# Patient Record
Sex: Male | Born: 1970 | Race: Black or African American | Hispanic: No | Marital: Single | Smoking: Never smoker
Health system: Southern US, Community
[De-identification: ages and names within clinical notes are randomized; demographics above are authoritative.]

## PROBLEM LIST (undated history)

## (undated) DIAGNOSIS — E785 Hyperlipidemia, unspecified: Secondary | ICD-10-CM

## (undated) DIAGNOSIS — I10 Essential (primary) hypertension: Secondary | ICD-10-CM

## (undated) DIAGNOSIS — I2510 Atherosclerotic heart disease of native coronary artery without angina pectoris: Secondary | ICD-10-CM

## (undated) DIAGNOSIS — E119 Type 2 diabetes mellitus without complications: Secondary | ICD-10-CM

## (undated) DIAGNOSIS — J45909 Unspecified asthma, uncomplicated: Secondary | ICD-10-CM

## (undated) DIAGNOSIS — E669 Obesity, unspecified: Secondary | ICD-10-CM

## (undated) HISTORY — DX: Type 2 diabetes mellitus without complications: E11.9

## (undated) HISTORY — PX: NO PAST SURGERIES: SHX2092

---

## 2010-06-15 ENCOUNTER — Emergency Department: Payer: Self-pay | Admitting: Emergency Medicine

## 2010-06-28 ENCOUNTER — Emergency Department: Payer: Self-pay | Admitting: Family Medicine

## 2013-09-08 ENCOUNTER — Ambulatory Visit: Payer: Self-pay

## 2013-10-07 ENCOUNTER — Inpatient Hospital Stay: Payer: Self-pay | Admitting: Psychiatry

## 2013-10-07 LAB — DRUG SCREEN, URINE
Barbiturates, Ur Screen: NEGATIVE (ref ?–200)
Benzodiazepine, Ur Scrn: NEGATIVE (ref ?–200)
Cocaine Metabolite,Ur ~~LOC~~: NEGATIVE (ref ?–300)
Methadone, Ur Screen: NEGATIVE (ref ?–300)
Opiate, Ur Screen: NEGATIVE (ref ?–300)
Phencyclidine (PCP) Ur S: NEGATIVE (ref ?–25)
Tricyclic, Ur Screen: NEGATIVE (ref ?–1000)

## 2013-10-07 LAB — URINALYSIS, COMPLETE
Bacteria: NONE SEEN
Glucose,UR: NEGATIVE mg/dL (ref 0–75)
Leukocyte Esterase: NEGATIVE
RBC,UR: 1 /HPF (ref 0–5)
Squamous Epithelial: NONE SEEN

## 2013-10-07 LAB — ETHANOL: Ethanol: <3 mg/dL

## 2013-10-07 LAB — COMPREHENSIVE METABOLIC PANEL
Albumin: 4.3 g/dL (ref 3.4–5.0)
Alkaline Phosphatase: 67 U/L (ref 50–136)
Anion Gap: 3 — ABNORMAL LOW (ref 7–16)
BUN: 17 mg/dL (ref 7–18)
Bilirubin,Total: 0.4 mg/dL (ref 0.2–1.0)
Chloride: 107 mmol/L (ref 98–107)
Co2: 29 mmol/L (ref 21–32)
EGFR (Non-African Amer.): >60
Osmolality: 279 (ref 275–301)
SGPT (ALT): 32 U/L (ref 12–78)

## 2013-10-07 LAB — TSH: Thyroid Stimulating Horm: 1.8 u[IU]/mL

## 2013-10-07 LAB — CBC
HCT: 46.2 % (ref 40.0–52.0)
MCV: 88 fL (ref 80–100)
RBC: 5.27 10*6/uL (ref 4.40–5.90)

## 2015-03-21 NOTE — Consult Note (Signed)
PATIENT NAME:  Oscar GivensWHITE, Oscar J MR#:  [id number] DATE OF BIRTH:  [birth year]  AGE:  44 years  SEX:  Male  RACE:  African-American  DATE OF DICTATION:  10/07/2013  PLACE OF DICTATION:  [facility], [facility] - [facility] Room, [place]    CONSULTING PHYSICIAN:  Khaleesi Gruel K. Asha Grumbine, MD  SUBJECTIVE:  The patient was seen in consultation in [facility] at [facility]. The patient is a 44 year old African-American male, not employed. Last worked many years ago. He is divorced for many years, and has been living with his mother, who is in her late 7060s. The patient reports that he was incarcerated from [id number] to 2011, and is currently with vocational rehabilitation for the past one year.   CHIEF COMPLAINT:  "I am ready to have a nervous breakdown."  HISTORY OF PRESENT ILLNESS:  The patient reports that he has been feeling depressed, been feeling very anxious, and wanting to talk to somebody because he has not reported this.   PAST PSYCHIATRIC HISTORY:  No previous history of inpatient psychiatry. No history of suicide attempts. Not being followed by a psychiatrist. Alcohol and drugs: Admits that he used to drink alcohol at a rate of 1 pint of hard liquor, such as vodka, a day for several years. Last drank 3 days ago, and he said he does not drink anymore. Denies any other street or prescription drug abuse. Denies smoking nicotine cigarettes.  MENTAL STATUS:  The patient was [facility] in [facility]. Alert and oriented, appears very anxious, talking about himself. Admits feeling depressed. Admits feeling low and down. Admits that he feels like he is ready to have a  nervous breakdown, and he feels that agitation coming out, and is restless and angry about the same. Denies auditory or visual hallucinations, but reports that he is so agitated that he feels he is paranoid . [place] [address], and his date of birth. Memory and recall are safe. Does have suicidal wishes and thoughts,  but contracts for safety, and he wants to get help as he has not slept in 2 days. Insight and judgment guarded.  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 Alcohol dependence, chronic, continuous. Personality disorder, with antisocial traits, and has been out of prison since 2011, after being incarcerated for several years.   RECOMMENDATIONS:  Recommend inpatient hospital in Psychiatry, Behavioral Health, for close observation. Meanwhile, he will be started on trazodone 100 mg at bedtime to help him with his sleep and rest. In addition, we started Invega 9 mg p.o. daily to help him with his inner agitation and restlessness. [facility].   ____________________________ Jannet MantisSurya K. Guss Bundehalla, MD skc:mr D: 10/07/2013 15:35:43 ET T: 10/07/2013 18:16:58 ET JOB#: [id number]  cc: Monika SalkSurya K. Guss Bundehalla, MD, <Dictator> Beau FannySURYA K Cashton Hosley MD ELECTRONICALLY SIGNED 10/08/2013 9:08

## 2015-03-21 NOTE — H&P (Signed)
PATIENT NAME:  Oscar Norris, Ordean J MR#:  [id number] DATE OF BIRTH:  [birth year]  DATE OF ADMISSION:  10/07/2013  REFERRING PHYSICIAN:  Emergency Room MD  ATTENDING PHYSICIAN: Sanaai Doane B. Jennet MaduroPucilowska, M.D.   IDENTIFYING DATA: [name] is a 44 year old male with reported history of schizophrenia and alcohol dependence.   CHIEF COMPLAINT:  "I'm very nervous."  HISTORY OF PRESENT ILLNESS: [name] was diagnosed with schizophrenia a couple of months ago reportedly by his vocational rehab counselor. He has been connected to [facility] and  has been receiving treatment with Haldol. Initially this was Haldol other pills, but later it was switched to Haldol decanoate. He has been given 100 mg IM of Haldol decanoate at the end of each month. He reports at the end of October he received dose. He is unclear why oral formulation was switched to injectable. He believes that this was because he developed back pain that was thought to be a side effect of Haldol and that is why he was switched to injection. The patient is not a good historian it is quite likely that his intellectual functioning is lower than average. He reports that he was in prison for 14 years for assault and was released in 2011. He has been trying to find a job, but it was not possible, but he started working with [employer] [employer] in hopes that he has a chance to be employed by [employer]. This is my understanding, work arrangement for people with disability. He believes that he would be able to get to this job soon. He struggles with alcohol. He drinks a pint of liquor each day and decided to stop since he wants to be sober and clean when he goes to work. He stopped drinking couple of days ago and most likely started going into alcohol withdrawals. He came to the hospital because he felt that he was in a nervous breakdown. He mostly blames insomnia for it. He has not been able to sleep for several days and feels very antsy, tired and unable to function.  He denies any symptoms of depression or psychosis. He appears anxious and reports panic attacks. He denies, other than alcohol, substance use. There were no symptoms suggestive of bipolar mania.   PAST PSYCHIATRIC HISTORY: He has never been hospitalized. No suicide attempts. [facility]. Interestingly, he was not in mental health while in prison.   FAMILY PSYCHIATRIC HISTORY: None reported.   PAST MEDICAL HISTORY: None.   ALLERGIES: No known drug allergies.   MEDICATIONS ON ADMISSION: Haldol decanoate 100 mg IM every month, albuterol as needed.   SOCIAL HISTORY: He lives with his mother. He is divorced and the mother is the major support.    REVIEW OF SYSTEMS: CONSTITUTIONAL: No fevers or chills. No weight changes.  EYES: No double or blurred vision.  ENT: No hearing loss.  RESPIRATORY: No shortness of breath or cough.  CARDIOVASCULAR: No chest pain or orthopnea.  GASTROINTESTINAL: No abdominal pain, nausea, vomiting or diarrhea.  GENITOURINARY: No incontinence or frequency.  ENDOCRINE: No heat or cold intolerance.  LYMPHATIC: No anemia or easy bruising.  INTEGUMENTARY: No acne or rash.  MUSCULOSKELETAL: No muscle or joint pain.  NEUROLOGIC: No tingling or weakness.  PSYCHIATRIC: See history of present illness for details.   PHYSICAL EXAMINATION: VITAL SIGNS: Blood pressure 143/95, pulse 78, respirations 20, temperature 98.  GENERAL: This is a well-developed male in no acute distress.  HEENT: The pupils are equal, round, and reactive to light.  Sclerae anicteric.  NECK: Supple. No thyromegaly.  LUNGS: Clear to auscultation. No done as to percussion.  HEART: Regular rhythm and rate. No murmurs, rubs, or gallops.  ABDOMEN: Soft, nontender, nondistended. Positive bowel sounds.  MUSCULOSKELETAL: Normal muscle strength in all extremities.  SKIN: No rashes or bruises.  LYMPHATIC: No cervical adenopathy.  NEUROLOGIC: Cranial nerves II through XII are intact.   LABORATORY DATA: Chemistries are within normal limits. Blood alcohol level is zero. LFTs within normal limits. TSH 1.8. Urine tox screen is negative for substances. CBC within normal limits. Urinalysis is not suggestive of urinary tract infection.   MENTAL STATUS EXAMINATION ON ADMISSION: The patient is alert and oriented to person, place, time and situation. He is pleasant, polite and cooperative. He is well groomed, wearing hospital scrubs. He maintains good eye contact. His speech is soft. Mood is depressed with anxious affect. Thought process is logical and goal oriented. Thought content: He denies suicidal or homicidal ideation. There are no delusions or paranoia. There are no auditory or visual hallucinations. His cognition is grossly intact. His insight and judgment are fair.   SUICIDE RISK ASSESSMENT ON ADMISSION: This is a patient with newly diagnosed psychotic disorder, most likely with some anxiety and insomnia who came to the hospital thinking that he nervous breakdown with some vague suicidal ideation without intention or a plan. He is at increased risk of suicide.   DIAGNOSES: AXIS I: Schizophrenia per history, anxiety disorder not otherwise specified.   AXIS II: Borderline intellectual functioning.   AXIS III: Asthma.   AXIS IV: Mental illness, employment, financial, access to care.   AXIS V: Global assessment of functioning 25.   PLAN: The patient was admitted to [facility] Medicine unit for safety, stabilization and medication management. He was initially placed on suicide precautions and was closely monitored for any unsafe behaviors. He underwent full psychiatric and risk assessment. He received pharmacotherapy, individual and group psychotherapy, substance abuse counseling, and support from therapeutic milieu.  1.  Psychosis. The patient is on Haldol injections but this was not known to Dr. Guss Bunde, who also started him on Invega oral tablets.   We will discontinue Invega.  He may need antianxiety medication rather than another antipsychotic.   2.  Insomnia. We will try trazodone as prescribed by Dr. Guss Bunde, adjusted as necessary.  3.  Medical.  He will be on albuterol. He complains of some neck pain and lower back pain that was thought to be due to EPS. I find it is unlikely. We will prescribe nonsteroidal antiinflammatory for that.   4. Substance abuse. The patient has been drinking alcohol excessively. He is on CIWA protocol. We will monitor for symptoms of withdrawal.  5.  Substance abuse treatment. He is not interested at the moment.    DISPOSITION: He will return to home with his mother.   ____________________________ Ellin Goodie. Jennet Maduro, MD jbp:cc D: 10/08/2013 18:26:26 ET T: 10/08/2013 19:14:09 ET JOB#: [id number]  cc: Vedha Tercero B. Jennet Maduro, MD, <Dictator> Shari Prows MD ELECTRONICALLY SIGNED 10/12/2013 20:44

## 2017-06-16 ENCOUNTER — Encounter: Payer: Self-pay | Admitting: *Deleted

## 2017-06-16 ENCOUNTER — Ambulatory Visit
Admission: EM | Admit: 2017-06-16 | Discharge: 2017-06-16 | Disposition: A | Discharge status: Home / Self Care | Payer: Medicaid Other | Attending: Family Medicine | Admitting: Family Medicine

## 2017-06-16 DIAGNOSIS — R300 Dysuria: Secondary | ICD-10-CM | POA: Diagnosis not present

## 2017-06-16 DIAGNOSIS — N342 Other urethritis: Secondary | ICD-10-CM

## 2017-06-16 LAB — URINALYSIS, COMPLETE (UACMP) WITH MICROSCOPIC
BILIRUBIN URINE: NEGATIVE
Bacteria, UA: NONE SEEN
GLUCOSE, UA: NEGATIVE mg/dL
Hgb urine dipstick: NEGATIVE
KETONES UR: NEGATIVE mg/dL
LEUKOCYTES UA: NEGATIVE
NITRITE: NEGATIVE
PH: 5.5 (ref 5.0–8.0)
Protein, ur: 30 mg/dL — AB
RBC / HPF: NONE SEEN RBC/hpf (ref 0–5)
Squamous Epithelial / LPF: NONE SEEN

## 2017-06-16 LAB — CHLAMYDIA/NGC RT PCR (ARMC ONLY)
Chlamydia Tr: NOT DETECTED
N GONORRHOEAE: NOT DETECTED

## 2017-06-16 MED ORDER — DOXYCYCLINE HYCLATE 100 MG PO CAPS: 100.0000 mg | ORAL_CAPSULE | Freq: Two times a day (BID) | ORAL | 0 refills | Status: DC

## 2017-06-16 NOTE — ED Triage Notes (Signed)
Dysuria following sex with a new partner. Denies other symptoms.

## 2017-06-16 NOTE — ED Provider Notes (Signed)
CSN: [id number]     Arrival date & time 06/16/17  1040 History   First MD Initiated Contact with Patient 06/16/17 1143     Chief Complaint  Patient presents with  . Dysuria   (Consider location/radiation/quality/duration/timing/severity/associated sxs/prior Treatment) HPI  This 46 year old male presents with complaints of dysuria. He states that approximately one week ago he had a 1 night stand with an acquaintance. He states that he wore a condom and did not have any problems with the condom during the sex act. Afterwards his initial urination was extremely painful feeling like he was peeing razor blades. It has continued since that time with each urination but has lessened in intensity and now has a tingling sensation. He feels this along the entire shaft of the penis. He denies any fever or chills. He has had no discharge from his penis. He does not have any testicular pain has not noticed any rashes or sores on his penile shaft or head.       History reviewed. No pertinent past medical history. History reviewed. No pertinent surgical history. Family History  Problem Relation Age of Onset  . Healthy Mother   . Healthy Father    Social History  Substance Use Topics  . Smoking status: Never Smoker  . Smokeless tobacco: Current User  . Alcohol use Yes    Review of Systems  Constitutional: Negative for activity change, appetite change, chills, fatigue and fever.  Genitourinary: Positive for difficulty urinating, dysuria and penile pain. Negative for decreased urine volume, discharge, genital sores, penile swelling, scrotal swelling, testicular pain and urgency.  All other systems reviewed and are negative.   Allergies  Patient has no known allergies.  Home Medications   Prior to Admission medications   Medication Sig Start Date End Date Taking? Authorizing Provider  mirtazapine (REMERON) 30 MG tablet Take 30 mg by mouth at bedtime.   Yes [provider]   doxycycline (VIBRAMYCIN) 100 MG capsule Take 1 capsule (100 mg total) by mouth 2 (two) times daily. 06/16/17   Lutricia Feil, PA-C   Meds Ordered and Administered this Visit  Medications - No data to display  BP 133/90 (BP Location: Left Arm)   Pulse 72   Temp 98.8 F (37.1 C) (Oral)   Resp 16   Ht 5\' 10"  (1.778 m)   Wt 175 lb (79.4 kg)   SpO2 97%   BMI 25.11 kg/m  No data found.   Physical Exam  Constitutional: He appears well-developed and well-nourished. No distress.  HENT:  Head: Normocephalic.  Eyes: Pupils are equal, round, and reactive to light.  Neck: Normal range of motion.  Abdominal: Soft. Bowel sounds are normal.  Genitourinary: Penis normal. No penile tenderness.  Genitourinary Comments: Examination of the genitals shows a normal male phallus. He is circumcised. There are no abnormalities of the skin, is no inguinal lymphadenopathy present. Is no discharge able to be expressed from the penis. Scrotum normal testicles are normal. Prostate exam was deferred to urology  Skin: He is not diaphoretic.  Nursing note and vitals reviewed.   Urgent Care Course     Procedures (including critical care time)  Labs Review Labs Reviewed  URINALYSIS, COMPLETE (UACMP) WITH MICROSCOPIC - Abnormal; Notable for the following:       Result Value   Specific Gravity, Urine >1.030 (*)    Protein, ur 30 (*)    All other components within normal limits  CHLAMYDIA/NGC RT PCR ([facility] ONLY)  HIV ANTIBODY (ROUTINE  TESTING)  RPR    Imaging Review No results found.   Visual Acuity Review  Right Eye Distance:   Left Eye Distance:   Bilateral Distance:    Right Eye Near:   Left Eye Near:    Bilateral Near:         MDM   1. Dysuria   2. Urethritis    Discharge Medication List as of 06/16/2017 12:13 PM    START taking these medications   Details  doxycycline (VIBRAMYCIN) 100 MG capsule Take 1 capsule (100 mg total) by mouth 2 (two) times daily.,  Starting Thu 06/16/2017, Normal      Plan: 1. Test/x-ray results and diagnosis reviewed with patient 2. rx as per orders; risks, benefits, potential side effects reviewed with patient 3. Recommend supportive treatment with practicing safe sex at all times. Will empirically treat possible urethritis with doxycycline awaiting results of the GC chlamydia HIV and RPR exams. Recommended the [facility] for possible appointment next week. He should follow-up with urology for evaluation. 4. F/u prn if symptoms worsen or don't improve     Lutricia FeilRoemer, Keny Donald P, PA-C 06/16/17 1234

## 2017-06-17 LAB — RPR: RPR Ser Ql: NONREACTIVE

## 2017-06-17 LAB — HIV ANTIBODY (ROUTINE TESTING W REFLEX): HIV Screen 4th Generation wRfx: NONREACTIVE

## 2017-06-22 ENCOUNTER — Telehealth: Payer: Self-pay | Admitting: *Deleted

## 2017-06-22 NOTE — Telephone Encounter (Signed)
Patient called after [facility]. Verified DOB, communicated negative gonorrhea, chlamydia, RPR, HIV, and urinalysis results. Patient confirmed understanding of lab results.

## 2017-08-17 ENCOUNTER — Ambulatory Visit
Admission: EM | Admit: 2017-08-17 | Discharge: 2017-08-17 | Disposition: A | Discharge status: Home / Self Care | Payer: Medicaid Other | Attending: Family Medicine | Admitting: Family Medicine

## 2017-08-17 DIAGNOSIS — S99921A Unspecified injury of right foot, initial encounter: Secondary | ICD-10-CM | POA: Diagnosis not present

## 2017-08-17 DIAGNOSIS — L84 Corns and callosities: Secondary | ICD-10-CM | POA: Diagnosis not present

## 2017-08-17 NOTE — ED Provider Notes (Addendum)
[facility] ____________________________________________  Time seen: Approximately 1024 AM  I have reviewed the triage vital signs and the nursing notes.   HISTORY  Chief Complaint Rash   HPI Oscar HIRT Sr. is a 46 y.o. male presenting for evaluation of right plantar foot rash that he states that he noticed 2 days ago when he had a shower. Patient reports he doesn't check his feet all the time, but reports he had not previously noticed this until Monday. Patient denies any pain or discomfort or itching associated with this. States he didn't even know it was there until he saw it. Patient reports he doesn't walk barefoot often, usually wears socks when he is wearing his shoes, but does have some black stool. Flaps that he often wears. Denies any pain. Denies injury, insect bite or known break in the skin. States that the area was initially much darker, but after rubbing the area it lightened and improved. Denies other complaints. Denies other aggravating or relieving factors. Denies recent sickness. Denies recent antibiotic use. States he wanted to make sure it was ok, and not a bad rash.   History reviewed. No pertinent past medical history.  There are no active problems to display for this patient.   Past Surgical History:  Procedure Laterality Date  . NO PAST SURGERIES       No current facility-administered medications for this encounter.   Current Outpatient Prescriptions:  .  doxycycline (VIBRAMYCIN) 100 MG capsule, Take 1 capsule (100 mg total) by mouth 2 (two) times daily., Disp: 20 capsule, Rfl: 0 .  mirtazapine (REMERON) 30 MG tablet, Take 30 mg by mouth at bedtime., Disp: , Rfl:   Allergies Patient has no known allergies.  Family History  Problem Relation Age of Onset  . Healthy Mother   . Healthy Father     Social History Social History  Substance Use Topics  . Smoking status: Never Smoker  . Smokeless tobacco: Never Used  . Alcohol use Yes       Comment: occasionally    Review of Systems Constitutional: No fever/chills Cardiovascular: Denies chest pain. Respiratory: Denies shortness of breath. Gastrointestinal: No abdominal pain. Musculoskeletal: Negative for back pain. Skin: As above.    ____________________________________________   PHYSICAL EXAM:  VITAL SIGNS: ED Triage Vitals  Enc Vitals Group     BP 08/17/17 1011 (!) 145/90     Pulse Rate 08/17/17 1011 73     Resp 08/17/17 1011 17     Temp 08/17/17 1011 98.2 F (36.8 C)     Temp Source 08/17/17 1011 Oral     SpO2 08/17/17 1011 99 %     Weight 08/17/17 1010 170 lb (77.1 kg)     Height 08/17/17 1010  (1.778 m)     Head Circumference --      Peak Flow --      Pain Score 08/17/17 1010 0     Pain Loc --      Pain Edu? --      Excl. in GC? --     Constitutional: Alert and oriented. Well appearing and in no acute distress. Cardiovascular: Normal rate, regular rhythm. Grossly normal heart sounds.  Good peripheral circulation. Respiratory: Normal respiratory effort without tachypnea nor retractions. Breath sounds are clear and equal bilaterally. No wheezes, rales, rhonchi. Musculoskeletal: Steady gait. Bilateral pedal pulses equal and easily palpated.  Neurologic:  Normal speech and language. Speech is normal. No gait instability.  Skin:  Skin is warm,  dry. Except: Right plantar mid to lateral distal foot superficial appearing partially calloused area with cracks with dark coloration to fissured area, no other discoloration, nontender, skin otherwise appears intact, no erythema, no drainage, no macular rash, no scaling, no other skin changes noted. Left plantar foot also with similar calloused skin and cracking, no discoloration.  Psychiatric: Mood and affect are normal. Speech and behavior are normal. Patient exhibits appropriate insight and judgment   ___________________________________________   LABS (all labs ordered are listed, but only abnormal  results are displayed)  Labs Reviewed - No data to display  PROCEDURES Procedures   INITIAL IMPRESSION / ASSESSMENT AND PLAN / ED COURSE  Pertinent labs & imaging results that were available during my care of the patient were reviewed by me and considered in my medical decision making (see chart for details).  Right plantar foot calloused area, with appearance of pigmentation to calloused area, suspect from his shoes rubbing off, no other skin changes noted. No appearance of infection. Discussed cleaning, soaking and monitoring, and supportive care.   Discussed follow up with Primary care physician this week. Discussed follow up and return parameters including no resolution or any worsening concerns. Patient verbalized understanding and agreed to plan.   ____________________________________________   FINAL CLINICAL IMPRESSION(S) / ED DIAGNOSES  Final diagnoses:  Foot callus     Discharge Medication List as of 08/17/2017 10:34 AM      Note: This dictation was prepared with Dragon dictation along with smaller phrase technology. Any transcriptional errors that result from this process are unintentional.         Renford Dills, NP 08/17/17 1217

## 2017-08-17 NOTE — ED Triage Notes (Signed)
Patient complains of right foot rash that he noticed Monday night after a shower. Patient states that area is not itchy.

## 2017-08-17 NOTE — Discharge Instructions (Signed)
Soak and keep clean as discussed. Monitor.  Follow up with your primary care physician this week as needed. Return to Urgent care for new or worsening concerns.

## 2019-11-11 ENCOUNTER — Ambulatory Visit
Admission: EM | Admit: 2019-11-11 | Discharge: 2019-11-11 | Disposition: A | Discharge status: Home / Self Care | Payer: Medicaid Other | Attending: Family Medicine | Admitting: Family Medicine

## 2019-11-11 ENCOUNTER — Other Ambulatory Visit: Payer: Self-pay

## 2019-11-11 ENCOUNTER — Ambulatory Visit: Payer: Medicaid Other

## 2019-11-11 DIAGNOSIS — J1282 Pneumonia due to coronavirus disease 2019: Secondary | ICD-10-CM

## 2019-11-11 DIAGNOSIS — U071 COVID-19: Secondary | ICD-10-CM | POA: Diagnosis present

## 2019-11-11 DIAGNOSIS — J1289 Other viral pneumonia: Secondary | ICD-10-CM | POA: Diagnosis not present

## 2019-11-11 LAB — RAPID INFLUENZA A&B ANTIGENS (ARMC ONLY): Influenza A (ARMC): NEGATIVE

## 2019-11-11 LAB — RAPID INFLUENZA A&B ANTIGENS: Influenza B (ARMC): NEGATIVE

## 2019-11-11 LAB — SARS CORONAVIRUS 2 AG (30 MIN TAT): SARS Coronavirus 2 Ag: POSITIVE — AB

## 2019-11-11 LAB — RAPID STREP SCREEN (MED CTR MEBANE ONLY): Streptococcus, Group A Screen (Direct): NEGATIVE

## 2019-11-11 MED ORDER — PREDNISONE 50 MG PO TABS: ORAL_TABLET | ORAL | 0 refills | Status: DC

## 2019-11-11 MED ORDER — BENZONATATE 200 MG PO CAPS: 200.0000 mg | ORAL_CAPSULE | Freq: Three times a day (TID) | ORAL | 0 refills | Status: DC | PRN

## 2019-11-11 MED ORDER — ACETAMINOPHEN 500 MG PO TABS
1000.0000 mg | ORAL_TABLET | Freq: Once | ORAL | Status: AC
  Administered 2019-11-11: 1000 mg via ORAL

## 2019-11-11 NOTE — ED Provider Notes (Addendum)
[facility]    CSN: [id number] Arrival date & time: 11/11/19  1302  History   Chief Complaint Chief Complaint  Patient presents with  . Sore Throat   HPI  48 year old male presents with respiratory symptoms and fever.  Patient reports that he has been sick for the past 2 days.  He reports fever and chills.  He is currently febrile at 103.1.  Also reports fatigue, sore throat, headache, cough, body aches.  He rates his pain as 10/10 in severity currently.  Seems to be most bothered by headache and back pain.  No reported sick contacts.  No known exposures to COVID-19.  He has taken NyQuil and Tylenol without resolution.  He has not taken anything for fever today.  No known exacerbating factors.  No known relieving factors.  No other complaints.  PMH, Surgical Hx, Family Hx, Social History reviewed and updated as below.  PMH: HLD, Asthma, ? Schizophrenia  Past Surgical History:  Procedure Laterality Date  . NO PAST SURGERIES     Home Medications    Prior to Admission medications   Medication Sig Start Date End Date Taking? Authorizing Provider  escitalopram (LEXAPRO) 5 MG tablet Take 5 mg by mouth daily.   Yes [provider]  benzonatate (TESSALON) 200 MG capsule Take 1 capsule (200 mg total) by mouth 3 (three) times daily as needed for cough. 11/11/19   Coral Spikes, DO  mirtazapine (REMERON) 30 MG tablet Take 30 mg by mouth at bedtime.    [provider]  predniSONE (DELTASONE) 50 MG tablet 1 tablet daily x 5 days 11/11/19   Coral Spikes, DO    Family History Family History  Problem Relation Age of Onset  . Healthy Mother   . Healthy Father     Social History Social History   Tobacco Use  . Smoking status: Never Smoker  . Smokeless tobacco: Never Used  Substance Use Topics  . Alcohol use: Yes    Comment: occasionally  . Drug use: No     Allergies   Patient has no known allergies.   Review of Systems Review of Systems   Constitutional: Positive for chills, fatigue and fever.  HENT: Positive for sore throat.   Respiratory: Positive for cough.   Musculoskeletal:       Body aches.  Neurological: Positive for headaches.   Physical Exam Triage Vital Signs ED Triage Vitals  Enc Vitals Group     BP 11/11/19 1316 (!) 141/79     Pulse Rate 11/11/19 1316 (!) 103     Resp 11/11/19 1316 18     Temp 11/11/19 1316 (!) 103.1 F (39.5 C)     Temp Source 11/11/19 1316 Oral     SpO2 11/11/19 1316 96 %     Weight 11/11/19 1314 165 lb (74.8 kg)     Height --      Head Circumference --      Peak Flow --      Pain Score 11/11/19 1314 10     Pain Loc --      Pain Edu? --      Excl. in [place]? --     Updated Vital Signs BP (!) 141/79 (BP Location: Left Arm)   Pulse (!) 103   Temp (!) 103.1 F (39.5 C) (Oral)   Resp 18   Wt 74.8 kg   SpO2 96%   BMI 23.68 kg/m   Visual Acuity Right Eye Distance:   Left  Eye Distance:   Bilateral Distance:    Right Eye Near:   Left Eye Near:    Bilateral Near:     Physical Exam Vitals and nursing note reviewed.  Constitutional:      Appearance: He is ill-appearing and diaphoretic.  HENT:     Head: Normocephalic and atraumatic.  Eyes:     General:        Right eye: No discharge.        Left eye: No discharge.     Conjunctiva/sclera: Conjunctivae normal.  Cardiovascular:     Rate and Rhythm: Tachycardia present.     Comments: Regularly irregular. Pulmonary:     Effort: Pulmonary effort is normal.     Breath sounds: Normal breath sounds. No wheezing or rales.  Skin:    General: Skin is warm.     Findings: No rash.  Neurological:     Mental Status: He is alert.  Psychiatric:        Behavior: Behavior normal.     Comments: Flat affect.    UC Treatments / Results  Labs (all labs ordered are listed, but only abnormal results are displayed) Labs Reviewed  SARS CORONAVIRUS 2 AG (30 MIN TAT) - Abnormal; Notable for the following components:      Result Value    SARS Coronavirus 2 Ag POSITIVE (*)    All other components within normal limits  RAPID INFLUENZA A&B ANTIGENS ([facility] ONLY)  RAPID STREP SCREEN ([facility] ONLY)  CULTURE, GROUP A STREP [facility])    EKG   Radiology DG Chest 2 View  Result Date: 11/11/2019 CLINICAL DATA:  Fever, congestion EXAM: CHEST - 2 VIEW COMPARISON:  06/16/2010 FINDINGS: The heart size and mediastinal contours are within normal limits. Subtle heterogeneous airspace opacities, most conspicuous in the right midlung. The visualized skeletal structures are unremarkable. IMPRESSION: Subtle heterogeneous airspace opacities, most conspicuous in the right midlung, concerning for infection. Electronically Signed   By: Lauralyn PrimesAlex  Bibbey M.D.   On: 11/11/2019 14:36    Procedures Procedures (including critical care time)  Medications Ordered in UC Medications  acetaminophen (TYLENOL) tablet 1,000 mg (1,000 mg Oral Given 11/11/19 1356)    Initial Impression / Assessment and Plan / UC Course  I have reviewed the triage vital signs and the nursing notes.  Pertinent labs & imaging results that were available during my care of the patient were reviewed by me and considered in my medical decision making (see chart for details).     48 year old male presents with signs and symptoms consistent with COVID-19.  Rapid Covid positive.  Chest x-ray consistent with pneumonia secondary to COVID-19.  Discussed findings with radiology.  Patient has known asthma.  Placing empirically on brief course of prednisone.  Tessalon Perles as prescribed.  Advised to go to the hospital if he worsens or develops shortness of breath.  Patient in agreement.  Patient is to stay home and quarantine.  Final Clinical Impressions(s) / UC Diagnoses   Final diagnoses:  COVID-19  Pneumonia due to COVID-19 virus     Discharge Instructions     Medications as directed.  If you worsen, go to the hospital.  Take care  Dr. Adriana Simasook     ED Prescriptions     Medication Sig Dispense Auth. Provider   predniSONE (DELTASONE) 50 MG tablet 1 tablet daily x 5 days 5 tablet Levia Waltermire G, DO   benzonatate (TESSALON) 200 MG capsule Take 1 capsule (200 mg total) by mouth 3 (three)  times daily as needed for cough. 20 capsule Tommie Sams, DO     PDMP not reviewed this encounter.   Tommie Sams, DO 11/11/19 1457    Tommie Sams, DO 11/11/19 1502

## 2019-11-11 NOTE — Discharge Instructions (Signed)
Medications as directed.  If you worsen, go to the hospital.  Take care  Dr. Lacinda Axon

## 2019-11-11 NOTE — ED Triage Notes (Signed)
Pt. Presents a fever of 103.1, nasal congestion, body aches/chills, fatigue, sore throat, headache. States he sweats then is cold at the same time, this started 2 days ago.

## 2019-11-14 LAB — CULTURE, GROUP A STREP (THRC)

## 2019-11-15 ENCOUNTER — Encounter: Payer: Self-pay | Admitting: Emergency Medicine

## 2019-11-15 ENCOUNTER — Emergency Department
Admission: EM | Admit: 2019-11-15 | Discharge: 2019-11-15 | Disposition: A | Discharge status: Home / Self Care | Payer: Medicaid Other | Attending: Emergency Medicine | Admitting: Emergency Medicine

## 2019-11-15 ENCOUNTER — Emergency Department: Payer: Medicaid Other

## 2019-11-15 ENCOUNTER — Other Ambulatory Visit: Payer: Self-pay

## 2019-11-15 DIAGNOSIS — R0609 Other forms of dyspnea: Secondary | ICD-10-CM

## 2019-11-15 DIAGNOSIS — U071 COVID-19: Secondary | ICD-10-CM

## 2019-11-15 DIAGNOSIS — R0602 Shortness of breath: Secondary | ICD-10-CM | POA: Diagnosis present

## 2019-11-15 MED ORDER — ALBUTEROL SULFATE HFA 108 (90 BASE) MCG/ACT IN AERS: 2.0000 | INHALATION_SPRAY | Freq: Four times a day (QID) | RESPIRATORY_TRACT | 0 refills | Status: AC | PRN

## 2019-11-15 MED ORDER — KETOROLAC TROMETHAMINE 10 MG PO TABS: 10.0000 mg | ORAL_TABLET | Freq: Four times a day (QID) | ORAL | 0 refills | Status: DC | PRN

## 2019-11-15 MED ORDER — TRAMADOL HCL 50 MG PO TABS: 50.0000 mg | ORAL_TABLET | Freq: Four times a day (QID) | ORAL | 0 refills | Status: DC | PRN

## 2019-11-15 MED ORDER — AZITHROMYCIN 250 MG PO TABS: ORAL_TABLET | ORAL | 0 refills | Status: AC

## 2019-11-15 MED ORDER — KETOROLAC TROMETHAMINE 60 MG/2ML IM SOLN
60.0000 mg | Freq: Once | INTRAMUSCULAR | Status: AC
  Administered 2019-11-15: 60 mg via INTRAMUSCULAR
  Filled 2019-11-15: qty 2

## 2019-11-15 NOTE — ED Provider Notes (Signed)
[facility] Emergency Department Provider Note   ____________________________________________   First MD Initiated Contact with Patient 11/15/19 1243     (approximate)  I have reviewed the triage vital signs and the nursing notes.   HISTORY  Chief Complaint Chills and Shortness of Breath    HPI Oscar J Dupree Sr. is a 48 y.o. male patient presents with fever/chills.  Mild dyspnea status post confirmed diagnosis of COVID-19 4 days ago.  Patient is on his last day of prednisone and continues to take Tessalon as needed for cough.  Patient states dyspnea with mild exertions.  Patient complain of fatigue and increasing upper low back pain.  Patient denies nausea, vomiting, diarrhea.  Patient able to tolerate food and fluids.  Patient is anxious because of the shortness of breath.         History reviewed. No pertinent past medical history.  There are no problems to display for this patient.   Past Surgical History:  Procedure Laterality Date   NO PAST SURGERIES      Prior to Admission medications   Medication Sig Start Date End Date Taking? Authorizing Provider  albuterol (VENTOLIN HFA) 108 (90 Base) MCG/ACT inhaler Inhale 2 puffs into the lungs every 6 (six) hours as needed for wheezing or shortness of breath. 11/15/19   Joni Reining, PA-C  azithromycin (ZITHROMAX Z-PAK) 250 MG tablet Take 2 tablets (500 mg) on  Day 1,  followed by 1 tablet (250 mg) once daily on Days 2 through 5. 11/15/19 11/20/19  Joni Reining, PA-C  benzonatate (TESSALON) 200 MG capsule Take 1 capsule (200 mg total) by mouth 3 (three) times daily as needed for cough. 11/11/19   Tommie Sams, DO  escitalopram (LEXAPRO) 5 MG tablet Take 5 mg by mouth daily.    [provider]  ketorolac (TORADOL) 10 MG tablet Take 1 tablet (10 mg total) by mouth every 6 (six) hours as needed. 11/15/19   Joni Reining, PA-C  mirtazapine (REMERON) 30 MG tablet Take 30 mg by mouth at  bedtime.    [provider]  predniSONE (DELTASONE) 50 MG tablet 1 tablet daily x 5 days 11/11/19   Tommie Sams, DO  traMADol (ULTRAM) 50 MG tablet Take 1 tablet (50 mg total) by mouth every 6 (six) hours as needed. 11/15/19 11/14/20  Joni Reining, PA-C    Allergies Patient has no known allergies.  Family History  Problem Relation Age of Onset   Healthy Mother    Healthy Father     Social History Social History   Tobacco Use   Smoking status: Never Smoker   Smokeless tobacco: Never Used  Substance Use Topics   Alcohol use: Yes    Comment: occasionally   Drug use: No    Review of Systems Constitutional:  Fever/chills.  Fatigue and body aches. Eyes: No visual changes. ENT: No sore throat. Cardiovascular: Denies chest pain. Respiratory:  shortness of breath. Gastrointestinal: No abdominal pain.  No nausea, no vomiting.  No diarrhea.  No constipation. Genitourinary: Negative for dysuria. Musculoskeletal: Positive for back pain. Skin: Negative for rash. Neurological: Negative for headaches, focal weakness or numbness.   ____________________________________________   PHYSICAL EXAM:  VITAL SIGNS: ED Triage Vitals  Enc Vitals Group     BP 11/15/19 1153 99/71     Pulse Rate 11/15/19 1153 97     Resp 11/15/19 1153 16     Temp 11/15/19 1153 99.7 F (37.6 C)  Temp Source 11/15/19 1153 Oral     SpO2 11/15/19 1153 95 %     Weight 11/15/19 1150 164 lb 14.5 oz (74.8 kg)     Height 11/15/19 1150 5\' 10"  (1.778 m)     Head Circumference --      Peak Flow --      Pain Score 11/15/19 1149 10     Pain Loc --      Pain Edu? --      Excl. in [place]? --    Constitutional: Alert and oriented. Well appearing and in no acute distress. Nose: No congestion/rhinnorhea. Mouth/Throat: Mucous membranes are moist.  Oropharynx non-erythematous. Neck: No stridor.  No cervical spine tenderness to palpation. Hematological/Lymphatic/Immunilogical: No cervical  lymphadenopathy. Cardiovascular: Normal rate, regular rhythm. Grossly normal heart sounds.  Good peripheral circulation. Respiratory: Normal respiratory effort.  No retractions. Lungs CTAB. Gastrointestinal: Soft and nontender. No distention. No abdominal bruits. No CVA tenderness. Genitourinary: Deferred Neurologic:  Normal speech and language. No gross focal neurologic deficits are appreciated. No gait instability. Skin:  Skin is warm, dry and intact. No rash noted. Psychiatric: Mood and affect are normal. Speech and behavior are normal.  ____________________________________________   LABS (all labs ordered are listed, but only abnormal results are displayed)  Labs Reviewed - No data to display ____________________________________________  EKG   ____________________________________________  RADIOLOGY  ED MD interpretation:    Official radiology report(s): DG Chest 2 View  Result Date: 11/15/2019 CLINICAL DATA:  COVID, shortness of breath EXAM: CHEST - 2 VIEW COMPARISON:  11/11/2019 FINDINGS: The heart size and mediastinal contours are within normal limits. Subtle heterogeneous airspace opacity is less clearly appreciated on this examination, possibly improved or less conspicuous due to improved lung volumes. The visualized skeletal structures are unremarkable. IMPRESSION: Subtle heterogeneous airspace opacity is less clearly appreciated on this examination, possibly improved or less conspicuous due to improved lung volumes. No new airspace opacity. Electronically Signed   By: Eddie Candle M.D.   On: 11/15/2019 12:32    ____________________________________________   PROCEDURES  Procedure(s) performed (including Critical Care):  Procedures   ____________________________________________   INITIAL IMPRESSION / ASSESSMENT AND PLAN / ED COURSE  As part of my medical decision making, I reviewed the following data within the [place]    Patient presents  with shortness of breath and anxiety secondary to being diagnosed with COVID-19.  Physical exam is grossly unremarkable.  Discussed x-ray findings with patient.  Mild concern for early pneumonia.  Patient reassurance that he is not in acute phase of COVID-19.  Patient given discharge care instruction.  Patient advised continue previous medications.  Patient given a prescription for Ventolin inhaler and a Z-Pak.  Return to ED if condition worsens.   Oscar Ped Melick Sr. was evaluated in Emergency Department on 11/15/2019 for the symptoms described in the history of present illness. He was evaluated in the context of the global COVID-19 pandemic, which necessitated consideration that the patient might be at risk for infection with the SARS-CoV-2 virus that causes COVID-19. Institutional protocols and algorithms that pertain to the evaluation of patients at risk for COVID-19 are in a state of rapid change based on information released by regulatory bodies including the CDC and federal and state organizations. These policies and algorithms were followed during the patient's care in the ED.       ____________________________________________   FINAL CLINICAL IMPRESSION(S) / ED DIAGNOSES  Final diagnoses:  COVID-19 virus infection  Dyspnea on exertion  ED Discharge Orders         Ordered    ketorolac (TORADOL) 10 MG tablet  Every 6 hours PRN     11/15/19 1314    azithromycin (ZITHROMAX Z-PAK) 250 MG tablet     11/15/19 1314    albuterol (VENTOLIN HFA) 108 (90 Base) MCG/ACT inhaler  Every 6 hours PRN     11/15/19 1314    traMADol (ULTRAM) 50 MG tablet  Every 6 hours PRN     11/15/19 1314           Note:  This document was prepared using Dragon voice recognition software and may include unintentional dictation errors.    Joni ReiningSmith, Jarod Bozzo K, PA-C 11/15/19 1323    Chesley NoonJessup, [place], MD 11/17/19 [phone]

## 2019-11-15 NOTE — Discharge Instructions (Addendum)
You are recovering from COVID-19.  Continue previous medications.  You have been prescribed an inhaler to use as directed for shortness of breath.  Advised extra strength Tylenol for fever.  You have been prescribed Zithromax to cover signs of early pneumonia.  Return to ED if condition worsens.

## 2019-11-15 NOTE — ED Triage Notes (Signed)
Says he has covid.  Says he is having cold sweats, having trouble breathing and doesn't think he can make it.

## 2019-11-15 NOTE — ED Triage Notes (Signed)
Pt in via EMS from home with increased SOB. Pt dx with COVID on Sunday 98.1, temp, took tylenol this am 120/76, 95-98%RA, HR 100.

## 2019-11-15 NOTE — ED Notes (Signed)
See triage note  Presents he was tested positive for COVID 4 days ago  Has had h/a and body aches and some SOB

## 2019-12-02 ENCOUNTER — Other Ambulatory Visit: Payer: Self-pay

## 2019-12-02 DIAGNOSIS — J45909 Unspecified asthma, uncomplicated: Secondary | ICD-10-CM | POA: Insufficient documentation

## 2019-12-02 DIAGNOSIS — R945 Abnormal results of liver function studies: Secondary | ICD-10-CM | POA: Diagnosis not present

## 2019-12-02 DIAGNOSIS — K828 Other specified diseases of gallbladder: Secondary | ICD-10-CM | POA: Diagnosis not present

## 2019-12-02 DIAGNOSIS — R1084 Generalized abdominal pain: Secondary | ICD-10-CM | POA: Diagnosis not present

## 2019-12-02 NOTE — ED Triage Notes (Signed)
Pt arrives via [facility] from home with cc of abd and flank pain to both sides. No injury, pain awoke pt from sleep around 10:30pm. Pt denies n/v. Pt states it hurts so bad it's making him shob.

## 2019-12-03 ENCOUNTER — Encounter: Payer: Self-pay | Admitting: Emergency Medicine

## 2019-12-03 ENCOUNTER — Emergency Department: Payer: Medicaid Other

## 2019-12-03 ENCOUNTER — Emergency Department
Admission: EM | Admit: 2019-12-03 | Discharge: 2019-12-03 | Disposition: A | Discharge status: Home / Self Care | Payer: Medicaid Other | Attending: Emergency Medicine | Admitting: Emergency Medicine

## 2019-12-03 DIAGNOSIS — R1084 Generalized abdominal pain: Secondary | ICD-10-CM

## 2019-12-03 DIAGNOSIS — R748 Abnormal levels of other serum enzymes: Secondary | ICD-10-CM

## 2019-12-03 HISTORY — DX: Unspecified asthma, uncomplicated: J45.909

## 2019-12-03 LAB — URINALYSIS, COMPLETE (UACMP) WITH MICROSCOPIC
Bacteria, UA: NONE SEEN
Bilirubin Urine: NEGATIVE
Glucose, UA: NEGATIVE mg/dL
Hgb urine dipstick: NEGATIVE
Ketones, ur: NEGATIVE mg/dL
Leukocytes,Ua: NEGATIVE
Nitrite: NEGATIVE
Protein, ur: 100 mg/dL — AB
Specific Gravity, Urine: 1.016 (ref 1.005–1.030)
Squamous Epithelial / HPF: NONE SEEN (ref 0–5)
pH: 7 (ref 5.0–8.0)

## 2019-12-03 LAB — COMPREHENSIVE METABOLIC PANEL
ALT: 103 U/L — ABNORMAL HIGH (ref 0–44)
AST: 129 U/L — ABNORMAL HIGH (ref 15–41)
Albumin: 4 g/dL (ref 3.5–5.0)
Alkaline Phosphatase: 74 U/L (ref 38–126)
Anion gap: 13 (ref 5–15)
BUN: 10 mg/dL (ref 6–20)
CO2: 25 mmol/L (ref 22–32)
Calcium: 9.2 mg/dL (ref 8.9–10.3)
Chloride: 102 mmol/L (ref 98–111)
Creatinine, Ser: 0.96 mg/dL (ref 0.61–1.24)
GFR calc Af Amer: >60 mL/min (ref >60)
GFR calc non Af Amer: >60 mL/min (ref >60)
Glucose, Bld: 153 mg/dL — ABNORMAL HIGH (ref 70–99)
Potassium: 3.7 mmol/L (ref 3.5–5.1)
Sodium: 140 mmol/L (ref 135–145)
Total Bilirubin: 0.7 mg/dL (ref 0.3–1.2)
Total Protein: 7.8 g/dL (ref 6.5–8.1)

## 2019-12-03 LAB — CBC
HCT: 37.7 % — ABNORMAL LOW (ref 39.0–52.0)
Hemoglobin: 13.2 g/dL (ref 13.0–17.0)
MCH: 29.1 pg (ref 26.0–34.0)
MCHC: 35 g/dL (ref 30.0–36.0)
MCV: 83.2 fL (ref 80.0–100.0)
Platelets: 278 10*3/uL (ref 150–400)
RBC: 4.53 MIL/uL (ref 4.22–5.81)
RDW: 14.4 % (ref 11.5–15.5)
WBC: 5.1 10*3/uL (ref 4.0–10.5)
nRBC: 0 % (ref 0.0–0.2)

## 2019-12-03 LAB — LIPASE, BLOOD: Lipase: 34 U/L (ref 11–51)

## 2019-12-03 NOTE — ED Notes (Signed)
See triage note  States he developed abd cramping last pm around 11 pm  denied any n/v/d/ or constipation  Afebrile on arrival

## 2019-12-03 NOTE — ED Provider Notes (Signed)
[facility] Emergency Department Provider Note   ____________________________________________   First MD Initiated Contact with Patient 12/03/19 [phone]     (approximate)  I have reviewed the triage vital signs and the nursing notes.   HISTORY  Chief Complaint Abdominal Pain and Flank Pain   HPI Oscar Held Wirick Sr. is a 49 y.o. male presents to the ED via EMS with complaint of abdominal pain and bilateral flank pain.  Patient denies any nausea, vomiting or diarrhea.  Patient states that he was diagnosed with Covid the middle of December.  He denies any shortness of breath, fever or chills related to this and states that he has been doing well.  He denies any shortness of breath other than his abdominal pain.  He states that he woke around 10:30 PM with abdominal pain.  He had a normal bowel movement this morning.  He denies any urinary symptoms.  He denies any known complications from his Covid and is feeling better.  He denies cough at this time.  Patient does report that he has not had any alcohol since being diagnosed with Covid however on a regular basis on the weekends he has approximately 10 shots per night when he gets together with the boys.       Past Medical History:  Diagnosis Date  . Asthma     There are no problems to display for this patient.   Past Surgical History:  Procedure Laterality Date  . NO PAST SURGERIES      Prior to Admission medications   Medication Sig Start Date End Date Taking? Authorizing Provider  albuterol (VENTOLIN HFA) 108 (90 Base) MCG/ACT inhaler Inhale 2 puffs into the lungs every 6 (six) hours as needed for wheezing or shortness of breath. 11/15/19   Joni Reining, PA-C  escitalopram (LEXAPRO) 5 MG tablet Take 5 mg by mouth daily.    [provider]  mirtazapine (REMERON) 30 MG tablet Take 30 mg by mouth at bedtime.    [provider]    Allergies Patient has no known allergies.  Family  History  Problem Relation Age of Onset  . Healthy Mother   . Healthy Father     Social History Social History   Tobacco Use  . Smoking status: Never Smoker  . Smokeless tobacco: Never Used  Substance Use Topics  . Alcohol use: Yes    Comment: occasionally  . Drug use: No    Review of Systems Constitutional: No fever/chills Eyes: No visual changes. ENT: No sore throat. Cardiovascular: Denies chest pain. Respiratory: Denies shortness of breath. Gastrointestinal: Positive abdominal pain.  No nausea, no vomiting.  No diarrhea.  No constipation. Genitourinary: Negative for dysuria.  Positive bilateral flank pain. Musculoskeletal: Negative for back pain. Skin: Negative for rash. Neurological: Negative for headaches, focal weakness or numbness. ____________________________________________   PHYSICAL EXAM:  VITAL SIGNS: ED Triage Vitals  Enc Vitals Group     BP 12/02/19 2352 (!) 150/91     Pulse Rate 12/02/19 2352 87     Resp 12/02/19 2352 (!) 22     Temp 12/02/19 2352 97.7 F (36.5 C)     Temp Source 12/02/19 2352 Oral     SpO2 12/02/19 2352 97 %     Weight 12/02/19 2353 164 lb 14.5 oz (74.8 kg)     Height 12/02/19 2353 5\' 10"  (1.778 m)     Head Circumference --      Peak Flow --  Pain Score 12/02/19 2353 10     Pain Loc --      Pain Edu? --      Excl. in GC? --    Constitutional: Alert and oriented. Well appearing and in no acute distress. Eyes: Conjunctivae are normal.  Head: Atraumatic. Neck: No stridor.   Cardiovascular: Normal rate, regular rhythm. Grossly normal heart sounds.  Good peripheral circulation. Respiratory: Normal respiratory effort.  No retractions. Lungs CTAB. Gastrointestinal: Soft and nontender. No distention.  Bowel sounds normoactive x4 quadrants.  No point tenderness or rebound is noted on exam. Musculoskeletal: Moves upper and lower extremities with any difficulty.  Normal gait was noted. Neurologic:  Normal speech and language. No  gross focal neurologic deficits are appreciated. No gait instability. Skin:  Skin is warm, dry and intact. No rash noted. Psychiatric: Mood and affect are normal. Speech and behavior are normal.  ____________________________________________   LABS (all labs ordered are listed, but only abnormal results are displayed)  Labs Reviewed  COMPREHENSIVE METABOLIC PANEL - Abnormal; Notable for the following components:      Result Value   Glucose, Bld 153 (*)    AST 129 (*)    ALT 103 (*)    All other components within normal limits  CBC - Abnormal; Notable for the following components:   HCT 37.7 (*)    All other components within normal limits  URINALYSIS, COMPLETE (UACMP) WITH MICROSCOPIC - Abnormal; Notable for the following components:   Color, Urine YELLOW (*)    APPearance CLEAR (*)    Protein, ur 100 (*)    All other components within normal limits  LIPASE, BLOOD     RADIOLOGY  Official radiology report(s): US Abdomen Complete  Result Date: 12/03/2019 CLINICAL DATA:  Abdominal and bilateral flank pain. EXAM: ABDOMEN ULTRASOUND COMPLETE COMPARISON:  None. FINDINGS: Gallbladder: Physiologically distended. Echogenic material within the gallbladder, some of which is rounded and non dependent consistent with sludge and sludge balls. Possible small gallstones. No gallbladder wall thickening. No pericholecystic fluid. No sonographic Murphy sign noted by sonographer. Common bile duct: Diameter: 5 mm, normal. Liver: Heterogeneously increased in parenchymal echogenicity. No evidence of focal lesion. Portal vein is patent on color Doppler imaging with normal direction of blood flow towards the liver. IVC: Not well demonstrated due to overlying bowel gas. Pancreas: Visualized portion unremarkable, majority is obscured by bowel gas. Spleen: Size and appearance within normal limits. Right Kidney: Length: 9.9 cm. Echogenicity within normal limits. No mass or hydronephrosis visualized. Left Kidney:  Length: 10.0 cm. Echogenicity within normal limits. No mass or hydronephrosis visualized. Abdominal aorta: Borderline aneurysmal at 3 cm in the mid aorta. Ectatic bilateral external iliac arteries. Other findings: No ascites. IMPRESSION: 1. Sludge and probable gallstones within the gallbladder. No sonographic findings of acute cholecystitis. 2. Hepatic steatosis. 3. 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 Recommend followup by ultrasound in 3 years. This recommendation follows ACR consensus guidelines: Beckerman Paper of the ACR Incidental Findings Committee II on Vascular Findings. J Am Coll Radiol 2013; 10:789-794. Aortic aneurysm NOS (ICD10-I71.9) Electronically Signed   By: Narda Rutherford M.D.   On: 12/03/2019 01:27    ____________________________________________   PROCEDURES  Procedure(s) performed (including Critical Care):  Procedures   ____________________________________________   INITIAL IMPRESSION / ASSESSMENT AND PLAN / ED COURSE  As part of my medical decision making, I reviewed the following data within the electronic MEDICAL RECORD NUMBER Notes from prior ED visits and [place] Controlled Substance Database Melvyn Neth  J Kozub Sr. was evaluated in Emergency Department on 12/03/2019 for the symptoms described in the history of present illness. He was evaluated in the context of the global COVID-19 pandemic, which necessitated consideration that the patient might be at risk for infection with the SARS-CoV-2 virus that causes COVID-19. Institutional protocols and algorithms that pertain to the evaluation of patients at risk for COVID-19 are in a state of rapid change based on information released by regulatory bodies including the CDC and federal and state organizations. These policies and algorithms were followed during the patient's care in the ED.  49 year old male presents to the ED via EMS with complaint of abdominal pain without nausea, vomiting or diarrhea.  He denies any fever or  chills.  He states he has not drank any alcohol since being diagnosed with Covid the middle of December.  He denies any shortness of breath or difficulty breathing.  He states he is feeling much better than he did with Covid.  Patient is unaware of any liver function test abnormalities.  Lab work is reassuring with the exception of elevated liver functions which could be related to Covid or his alcohol intake at this time is unsure.  Patient was also made aware that he has sludge in his gallbladder with possible gallstones.  Also it was recommended that he follow-up and have an ultrasound of his abdomen to reevaluate a small aneurysm in approximately 3 years.  Patient is encouraged to follow-up with his PCP at [facility]  Patient was stable at the time of discharge and was amatory without any assistance.    ____________________________________________   FINAL CLINICAL IMPRESSION(S) / ED DIAGNOSES  Final diagnoses:  Generalized abdominal pain  Elevated liver enzymes     ED Discharge Orders    None       Note:  This document was prepared using Dragon voice recognition software and may include unintentional dictation errors.    Johnn Hai, PA-C 12/03/19 4034    Arta Silence, MD 12/03/19 [phone]

## 2019-12-03 NOTE — ED Notes (Signed)
called patient no answer

## 2019-12-03 NOTE — Discharge Instructions (Addendum)
Follow-up with your primary care provider if any continued problems.  Return to the emergency department for any severe worsening of your symptoms or difficulty breathing.  The test done today shows elevated liver enzymes and your blood work.  This should be rechecked by your primary care provider.  Decrease the amount of alcohol that you are drinking with your friends on the weekend at this time to see if this is related.  Also a small aneurysm was noted on your ultrasound.  It is recommended by the radiologist that she have an ultrasound done in 3 years to evaluate this.  This can be done at your primary care provider.  Also as we discussed there was sludge noted in your gallbladder on your ultrasound.  You may in the future have gallstones.  Watch the amount of fat that you are eating at this time as this could aggravate your gallbladder.  Information about gallstones was also provided in your discharge instructions.

## 2020-02-25 ENCOUNTER — Other Ambulatory Visit: Payer: Self-pay

## 2020-02-25 DIAGNOSIS — Z20822 Contact with and (suspected) exposure to covid-19: Secondary | ICD-10-CM

## 2020-02-26 LAB — NOVEL CORONAVIRUS, NAA: SARS-CoV-2, NAA: NOT DETECTED

## 2020-02-26 LAB — SARS-COV-2, NAA 2 DAY TAT

## 2020-04-06 ENCOUNTER — Other Ambulatory Visit: Payer: Self-pay

## 2020-04-06 ENCOUNTER — Encounter: Payer: Self-pay | Admitting: *Deleted

## 2020-04-06 DIAGNOSIS — I160 Hypertensive urgency: Secondary | ICD-10-CM | POA: Diagnosis not present

## 2020-04-06 DIAGNOSIS — E1165 Type 2 diabetes mellitus with hyperglycemia: Secondary | ICD-10-CM | POA: Insufficient documentation

## 2020-04-06 DIAGNOSIS — I2510 Atherosclerotic heart disease of native coronary artery without angina pectoris: Secondary | ICD-10-CM | POA: Insufficient documentation

## 2020-04-06 DIAGNOSIS — R9431 Abnormal electrocardiogram [ECG] [EKG]: Secondary | ICD-10-CM | POA: Diagnosis not present

## 2020-04-06 DIAGNOSIS — Z7951 Long term (current) use of inhaled steroids: Secondary | ICD-10-CM | POA: Diagnosis not present

## 2020-04-06 DIAGNOSIS — Z20822 Contact with and (suspected) exposure to covid-19: Secondary | ICD-10-CM | POA: Diagnosis not present

## 2020-04-06 DIAGNOSIS — Z79899 Other long term (current) drug therapy: Secondary | ICD-10-CM | POA: Insufficient documentation

## 2020-04-06 DIAGNOSIS — E785 Hyperlipidemia, unspecified: Secondary | ICD-10-CM | POA: Diagnosis not present

## 2020-04-06 DIAGNOSIS — J45909 Unspecified asthma, uncomplicated: Secondary | ICD-10-CM | POA: Insufficient documentation

## 2020-04-06 DIAGNOSIS — Z8249 Family history of ischemic heart disease and other diseases of the circulatory system: Secondary | ICD-10-CM | POA: Diagnosis not present

## 2020-04-06 DIAGNOSIS — R079 Chest pain, unspecified: Secondary | ICD-10-CM | POA: Diagnosis present

## 2020-04-06 DIAGNOSIS — I10 Essential (primary) hypertension: Secondary | ICD-10-CM | POA: Diagnosis not present

## 2020-04-06 DIAGNOSIS — I214 Non-ST elevation (NSTEMI) myocardial infarction: Secondary | ICD-10-CM | POA: Diagnosis not present

## 2020-04-06 MED ORDER — SODIUM CHLORIDE 0.9% FLUSH
3.0000 mL | Freq: Once | INTRAVENOUS | Status: AC
  Administered 2020-04-07: 01:00:00 3 mL via INTRAVENOUS

## 2020-04-06 NOTE — ED Notes (Signed)
Patient to waiting room via wheelchair by EMS for chest pain.  Per EMS patient with midsternal chest pain that radiates to the right off/on since Friday.  Patient with history of untreated hypertension.  EMS interventions - ASA 324mg , Ntg x 1 pain from 10 to 3, cbg 198, IV via 20 g to right antecub, 12-lead showing normal sinus.  EMS vital - p 82, bp 183 114, pulse oxi 93% on room air

## 2020-04-06 NOTE — ED Triage Notes (Addendum)
Pt brought in via ems from home with chest pain.  Pt reports heart beating fast with exertion.  Intermittent sob.  No n/v/d.  Nonsmoker. Pt alert  Speech clear.   Iv in place

## 2020-04-07 ENCOUNTER — Encounter
Admission: EM | Disposition: A | Discharge status: Other Acute Inpatient Hospital | Payer: Self-pay | Source: Home / Self Care | Attending: Student

## 2020-04-07 ENCOUNTER — Encounter (HOSPITAL_COMMUNITY): Payer: Self-pay | Admitting: Cardiology

## 2020-04-07 ENCOUNTER — Emergency Department: Payer: Medicaid Other

## 2020-04-07 ENCOUNTER — Observation Stay
Admission: EM | Admit: 2020-04-07 | Discharge: 2020-04-08 | Disposition: A | Discharge status: Other Acute Inpatient Hospital | Payer: Medicaid Other | Attending: Internal Medicine | Admitting: Internal Medicine

## 2020-04-07 DIAGNOSIS — R739 Hyperglycemia, unspecified: Secondary | ICD-10-CM

## 2020-04-07 DIAGNOSIS — E785 Hyperlipidemia, unspecified: Secondary | ICD-10-CM

## 2020-04-07 DIAGNOSIS — R079 Chest pain, unspecified: Secondary | ICD-10-CM | POA: Diagnosis not present

## 2020-04-07 DIAGNOSIS — F329 Major depressive disorder, single episode, unspecified: Secondary | ICD-10-CM | POA: Diagnosis present

## 2020-04-07 DIAGNOSIS — I160 Hypertensive urgency: Secondary | ICD-10-CM | POA: Diagnosis not present

## 2020-04-07 DIAGNOSIS — E118 Type 2 diabetes mellitus with unspecified complications: Secondary | ICD-10-CM

## 2020-04-07 DIAGNOSIS — I10 Essential (primary) hypertension: Secondary | ICD-10-CM

## 2020-04-07 DIAGNOSIS — E669 Obesity, unspecified: Secondary | ICD-10-CM

## 2020-04-07 DIAGNOSIS — I2510 Atherosclerotic heart disease of native coronary artery without angina pectoris: Secondary | ICD-10-CM

## 2020-04-07 DIAGNOSIS — R9431 Abnormal electrocardiogram [ECG] [EKG]: Secondary | ICD-10-CM

## 2020-04-07 DIAGNOSIS — F32A Depression, unspecified: Secondary | ICD-10-CM | POA: Diagnosis present

## 2020-04-07 DIAGNOSIS — I214 Non-ST elevation (NSTEMI) myocardial infarction: Secondary | ICD-10-CM | POA: Diagnosis present

## 2020-04-07 HISTORY — PX: LEFT HEART CATH AND CORONARY ANGIOGRAPHY: CATH118249

## 2020-04-07 LAB — LIPASE, BLOOD: Lipase: 26 U/L (ref 11–51)

## 2020-04-07 LAB — BASIC METABOLIC PANEL
Anion gap: 10 (ref 5–15)
BUN: 11 mg/dL (ref 6–20)
CO2: 24 mmol/L (ref 22–32)
Calcium: 8.8 mg/dL — ABNORMAL LOW (ref 8.9–10.3)
Chloride: 101 mmol/L (ref 98–111)
Creatinine, Ser: 0.93 mg/dL (ref 0.61–1.24)
GFR calc Af Amer: >60 mL/min (ref >60)
GFR calc non Af Amer: >60 mL/min (ref >60)
Glucose, Bld: 279 mg/dL — ABNORMAL HIGH (ref 70–99)
Potassium: 3.9 mmol/L (ref 3.5–5.1)
Sodium: 135 mmol/L (ref 135–145)

## 2020-04-07 LAB — COMPREHENSIVE METABOLIC PANEL
ALT: 24 U/L (ref 0–44)
AST: 23 U/L (ref 15–41)
Albumin: 4 g/dL (ref 3.5–5.0)
Alkaline Phosphatase: 55 U/L (ref 38–126)
Anion gap: 8 (ref 5–15)
BUN: 10 mg/dL (ref 6–20)
CO2: 27 mmol/L (ref 22–32)
Calcium: 8.7 mg/dL — ABNORMAL LOW (ref 8.9–10.3)
Chloride: 102 mmol/L (ref 98–111)
Creatinine, Ser: 0.93 mg/dL (ref 0.61–1.24)
GFR calc Af Amer: >60 mL/min (ref >60)
GFR calc non Af Amer: >60 mL/min (ref >60)
Glucose, Bld: 164 mg/dL — ABNORMAL HIGH (ref 70–99)
Potassium: 3.7 mmol/L (ref 3.5–5.1)
Sodium: 137 mmol/L (ref 135–145)
Total Bilirubin: 0.5 mg/dL (ref 0.3–1.2)
Total Protein: 7.5 g/dL (ref 6.5–8.1)

## 2020-04-07 LAB — CBC
HCT: 41.2 % (ref 39.0–52.0)
Hemoglobin: 14.3 g/dL (ref 13.0–17.0)
MCH: 29.8 pg (ref 26.0–34.0)
MCHC: 34.7 g/dL (ref 30.0–36.0)
MCV: 85.8 fL (ref 80.0–100.0)
Platelets: 214 10*3/uL (ref 150–400)
RBC: 4.8 MIL/uL (ref 4.22–5.81)
RDW: 13.5 % (ref 11.5–15.5)
WBC: 5 10*3/uL (ref 4.0–10.5)
nRBC: 0 % (ref 0.0–0.2)

## 2020-04-07 LAB — PROTIME-INR
INR: 1.1 (ref 0.8–1.2)
Prothrombin Time: 13.4 seconds (ref 11.4–15.2)

## 2020-04-07 LAB — LIPID PANEL
Cholesterol: 198 mg/dL (ref 0–200)
HDL: 35 mg/dL — ABNORMAL LOW (ref >40)
LDL Cholesterol: UNDETERMINED mg/dL (ref 0–99)
Total CHOL/HDL Ratio: 5.7 RATIO
Triglycerides: 468 mg/dL — ABNORMAL HIGH (ref <150)
VLDL: UNDETERMINED mg/dL (ref 0–40)

## 2020-04-07 LAB — TROPONIN I (HIGH SENSITIVITY)
Troponin I (High Sensitivity): 67 ng/L — ABNORMAL HIGH (ref <18)
Troponin I (High Sensitivity): 82 ng/L — ABNORMAL HIGH (ref <18)
Troponin I (High Sensitivity): 73 ng/L — ABNORMAL HIGH (ref <18)
Troponin I (High Sensitivity): 62 ng/L — ABNORMAL HIGH (ref <18)

## 2020-04-07 LAB — HEPARIN LEVEL (UNFRACTIONATED): Heparin Unfractionated: 0.23 IU/mL — ABNORMAL LOW (ref 0.30–0.70)

## 2020-04-07 LAB — FIBRIN DERIVATIVES D-DIMER (ARMC ONLY): Fibrin derivatives D-dimer (ARMC): 190.11 ng/mL (FEU) (ref 0.00–499.00)

## 2020-04-07 LAB — BRAIN NATRIURETIC PEPTIDE: B Natriuretic Peptide: 10 pg/mL (ref 0.0–100.0)

## 2020-04-07 LAB — HEMOGLOBIN A1C
Hgb A1c MFr Bld: 7 % — ABNORMAL HIGH (ref 4.8–5.6)
Mean Plasma Glucose: 154.2 mg/dL

## 2020-04-07 LAB — GLUCOSE, CAPILLARY
Glucose-Capillary: 127 mg/dL — ABNORMAL HIGH (ref 70–99)
Glucose-Capillary: 140 mg/dL — ABNORMAL HIGH (ref 70–99)

## 2020-04-07 LAB — LDL CHOLESTEROL, DIRECT: Direct LDL: 84.2 mg/dL (ref 0–99)

## 2020-04-07 LAB — APTT: aPTT: 34 seconds (ref 24–36)

## 2020-04-07 LAB — MAGNESIUM: Magnesium: 2.4 mg/dL (ref 1.7–2.4)

## 2020-04-07 LAB — SARS CORONAVIRUS 2 BY RT PCR (HOSPITAL ORDER, PERFORMED IN ~~LOC~~ HOSPITAL LAB): SARS Coronavirus 2: NEGATIVE

## 2020-04-07 SURGERY — LEFT HEART CATH AND CORONARY ANGIOGRAPHY
Anesthesia: Moderate Sedation

## 2020-04-07 MED ORDER — ACETAMINOPHEN 325 MG PO TABS: 650.0000 mg | ORAL_TABLET | ORAL | Status: DC | PRN

## 2020-04-07 MED ORDER — HEPARIN (PORCINE) IN NACL 1000-0.9 UT/500ML-% IV SOLN
INTRAVENOUS | Status: AC
  Filled 2020-04-07: qty 1000

## 2020-04-07 MED ORDER — ESCITALOPRAM OXALATE 10 MG PO TABS
5.0000 mg | ORAL_TABLET | Freq: Every day | ORAL | Status: DC
  Administered 2020-04-07 – 2020-04-08 (×2): 5 mg via ORAL
  Filled 2020-04-07: qty 0.5
  Filled 2020-04-07: qty 1

## 2020-04-07 MED ORDER — HEPARIN BOLUS VIA INFUSION
1200.0000 [IU] | Freq: Once | INTRAVENOUS | Status: AC
  Administered 2020-04-07: 10:00:00 1200 [IU] via INTRAVENOUS
  Filled 2020-04-07: qty 1200

## 2020-04-07 MED ORDER — ATORVASTATIN CALCIUM 20 MG PO TABS: 20.0000 mg | ORAL_TABLET | Freq: Every day | ORAL | Status: DC

## 2020-04-07 MED ORDER — SODIUM CHLORIDE 0.9 % WEIGHT BASED INFUSION: 1.0000 mL/kg/h | INTRAVENOUS | Status: AC

## 2020-04-07 MED ORDER — SODIUM CHLORIDE 0.9 % IV SOLN: 250.0000 mL | INTRAVENOUS | Status: DC | PRN

## 2020-04-07 MED ORDER — HYDRALAZINE HCL 20 MG/ML IJ SOLN
INTRAMUSCULAR | Status: AC
  Administered 2020-04-07: 18:00:00 10 mg via INTRAVENOUS
  Filled 2020-04-07: qty 1

## 2020-04-07 MED ORDER — HEPARIN SODIUM (PORCINE) 1000 UNIT/ML IJ SOLN
INTRAMUSCULAR | Status: DC | PRN
  Administered 2020-04-07: 4000 [IU] via INTRAVENOUS

## 2020-04-07 MED ORDER — MORPHINE SULFATE (PF) 2 MG/ML IV SOLN: 2.0000 mg | INTRAVENOUS | Status: DC | PRN

## 2020-04-07 MED ORDER — SODIUM CHLORIDE 0.9 % WEIGHT BASED INFUSION
3.0000 mL/kg/h | INTRAVENOUS | Status: DC
  Administered 2020-04-07: 3 mL/kg/h via INTRAVENOUS

## 2020-04-07 MED ORDER — LABETALOL HCL 5 MG/ML IV SOLN
INTRAVENOUS | Status: AC
  Filled 2020-04-07: qty 4

## 2020-04-07 MED ORDER — FENTANYL CITRATE (PF) 100 MCG/2ML IJ SOLN
INTRAMUSCULAR | Status: AC
  Filled 2020-04-07: qty 2

## 2020-04-07 MED ORDER — VERAPAMIL HCL 2.5 MG/ML IV SOLN
INTRAVENOUS | Status: DC | PRN
  Administered 2020-04-07: 2.5 mg via INTRA_ARTERIAL

## 2020-04-07 MED ORDER — SODIUM CHLORIDE 0.9 % IV SOLN: INTRAVENOUS | Status: DC

## 2020-04-07 MED ORDER — VERAPAMIL HCL 2.5 MG/ML IV SOLN
INTRAVENOUS | Status: AC
  Filled 2020-04-07: qty 2

## 2020-04-07 MED ORDER — ZOLPIDEM TARTRATE 5 MG PO TABS: 5.0000 mg | ORAL_TABLET | Freq: Every evening | ORAL | Status: DC | PRN

## 2020-04-07 MED ORDER — HEPARIN (PORCINE) 25000 UT/250ML-% IV SOLN
1300.0000 [IU]/h | INTRAVENOUS | Status: DC
  Administered 2020-04-07: 1150 [IU]/h via INTRAVENOUS
  Filled 2020-04-07: qty 250

## 2020-04-07 MED ORDER — LABETALOL HCL 5 MG/ML IV SOLN
INTRAVENOUS | Status: DC | PRN
  Administered 2020-04-07 (×2): 20 mg via INTRAVENOUS

## 2020-04-07 MED ORDER — LABETALOL HCL 5 MG/ML IV SOLN: 10.0000 mg | INTRAVENOUS | Status: AC | PRN

## 2020-04-07 MED ORDER — SODIUM CHLORIDE 0.9% FLUSH
3.0000 mL | Freq: Two times a day (BID) | INTRAVENOUS | Status: DC
  Administered 2020-04-08: 09:00:00 3 mL via INTRAVENOUS

## 2020-04-07 MED ORDER — NITROGLYCERIN 0.4 MG SL SUBL: 0.4000 mg | SUBLINGUAL_TABLET | SUBLINGUAL | Status: DC | PRN

## 2020-04-07 MED ORDER — ALUM & MAG HYDROXIDE-SIMETH 200-200-20 MG/5ML PO SUSP
30.0000 mL | Freq: Once | ORAL | Status: AC
  Administered 2020-04-07: 30 mL via ORAL
  Filled 2020-04-07: qty 30

## 2020-04-07 MED ORDER — HYDRALAZINE HCL 20 MG/ML IJ SOLN: 10.0000 mg | INTRAMUSCULAR | Status: AC | PRN

## 2020-04-07 MED ORDER — ASPIRIN EC 81 MG PO TBEC
81.0000 mg | DELAYED_RELEASE_TABLET | Freq: Every day | ORAL | Status: DC
  Administered 2020-04-07 – 2020-04-08 (×2): 81 mg via ORAL
  Filled 2020-04-07 (×2): qty 1

## 2020-04-07 MED ORDER — ONDANSETRON HCL 4 MG/2ML IJ SOLN: 4.0000 mg | Freq: Four times a day (QID) | INTRAMUSCULAR | Status: DC | PRN

## 2020-04-07 MED ORDER — LIDOCAINE VISCOUS HCL 2 % MT SOLN
15.0000 mL | Freq: Once | OROMUCOSAL | Status: AC
  Administered 2020-04-07: 15 mL via ORAL
  Filled 2020-04-07: qty 15

## 2020-04-07 MED ORDER — HEPARIN SODIUM (PORCINE) 1000 UNIT/ML IJ SOLN
INTRAMUSCULAR | Status: AC
  Filled 2020-04-07: qty 1

## 2020-04-07 MED ORDER — MIDAZOLAM HCL 2 MG/2ML IJ SOLN
INTRAMUSCULAR | Status: DC | PRN
  Administered 2020-04-07: 1 mg via INTRAVENOUS

## 2020-04-07 MED ORDER — HEPARIN BOLUS VIA INFUSION
4000.0000 [IU] | Freq: Once | INTRAVENOUS | Status: AC
  Administered 2020-04-07: 03:00:00 4000 [IU] via INTRAVENOUS
  Filled 2020-04-07: qty 4000

## 2020-04-07 MED ORDER — ALBUTEROL SULFATE (2.5 MG/3ML) 0.083% IN NEBU: 2.5000 mg | INHALATION_SOLUTION | Freq: Four times a day (QID) | RESPIRATORY_TRACT | Status: DC | PRN

## 2020-04-07 MED ORDER — HEPARIN (PORCINE) IN NACL 1000-0.9 UT/500ML-% IV SOLN
INTRAVENOUS | Status: DC | PRN
  Administered 2020-04-07: 500 mL

## 2020-04-07 MED ORDER — SODIUM CHLORIDE 0.9 % WEIGHT BASED INFUSION: 1.0000 mL/kg/h | INTRAVENOUS | Status: DC

## 2020-04-07 MED ORDER — ASPIRIN 81 MG PO CHEW: 81.0000 mg | CHEWABLE_TABLET | ORAL | Status: DC

## 2020-04-07 MED ORDER — MIRTAZAPINE 15 MG PO TABS
30.0000 mg | ORAL_TABLET | Freq: Every day | ORAL | Status: DC
  Administered 2020-04-07: 21:00:00 30 mg via ORAL
  Filled 2020-04-07: qty 2

## 2020-04-07 MED ORDER — INSULIN ASPART 100 UNIT/ML ~~LOC~~ SOLN
0.0000 [IU] | Freq: Four times a day (QID) | SUBCUTANEOUS | Status: DC
  Administered 2020-04-07 – 2020-04-08 (×2): 2 [IU] via SUBCUTANEOUS
  Filled 2020-04-07 (×2): qty 1

## 2020-04-07 MED ORDER — SODIUM CHLORIDE 0.9% FLUSH: 3.0000 mL | INTRAVENOUS | Status: DC | PRN

## 2020-04-07 MED ORDER — MIDAZOLAM HCL 2 MG/2ML IJ SOLN
INTRAMUSCULAR | Status: AC
  Filled 2020-04-07: qty 2

## 2020-04-07 MED ORDER — LABETALOL HCL 5 MG/ML IV SOLN
20.0000 mg | INTRAVENOUS | Status: DC | PRN
  Administered 2020-04-07: 04:00:00 20 mg via INTRAVENOUS
  Filled 2020-04-07: qty 4

## 2020-04-07 MED ORDER — ATORVASTATIN CALCIUM 20 MG PO TABS
40.0000 mg | ORAL_TABLET | Freq: Every day | ORAL | Status: DC
  Administered 2020-04-07: 03:00:00 40 mg via ORAL
  Filled 2020-04-07: qty 2

## 2020-04-07 MED ORDER — FENTANYL CITRATE (PF) 100 MCG/2ML IJ SOLN
INTRAMUSCULAR | Status: DC | PRN
  Administered 2020-04-07: 50 ug via INTRAVENOUS

## 2020-04-07 MED ORDER — ALPRAZOLAM 0.25 MG PO TABS: 0.2500 mg | ORAL_TABLET | Freq: Two times a day (BID) | ORAL | Status: DC | PRN

## 2020-04-07 MED ORDER — METOPROLOL SUCCINATE ER 25 MG PO TB24
25.0000 mg | ORAL_TABLET | Freq: Every day | ORAL | Status: DC
  Administered 2020-04-07 – 2020-04-08 (×2): 25 mg via ORAL
  Filled 2020-04-07 (×2): qty 1

## 2020-04-07 SURGICAL SUPPLY — 8 items
CATH INFINITI 5 FR JL3.5 (CATHETERS) ×1 IMPLANT
CATH INFINITI JR4 5F (CATHETERS) ×1 IMPLANT
DEVICE RAD TR BAND REGULAR (VASCULAR PRODUCTS) ×1 IMPLANT
GLIDESHEATH SLEND SS 6F .021 (SHEATH) ×1 IMPLANT
GUIDEWIRE INQWIRE 1.5J.035X260 (WIRE) IMPLANT
INQWIRE 1.5J .035X260CM (WIRE) ×2
KIT MANI 3VAL PERCEP (MISCELLANEOUS) ×2 IMPLANT
PACK CARDIAC CATH (CUSTOM PROCEDURE TRAY) ×1 IMPLANT

## 2020-04-07 NOTE — H&P (Signed)
Cardiology Admission History and Physical:   Patient ID: Oscar Ped Baune Sr. MRN: [id number]; DOB: October 30, 1971   Admission date: 04/07/2020  Primary Care Provider: [facility] [place] [facility] Primary Cardiologist: Corey Skains, MD  Primary Electrophysiologist:  None   Chief Complaint:  Chest pain  Patient Profile:   Oscar PLANTZ Sr. is a 49 y.o. male with hypertension, hyperlipidemia, COVID-19 in 10/2019, obesity, and asthma who presented to [facility] on 5/10 with a 3-day history of chest pain and was found to have a NSTEMI along with newly diagnosed diabetes.  History of Present Illness:   [name] had no previously known cardiac history.  He was diagnosed with COVID-19 in 10/2019 though did not require hospital admission.  He indicates he had recently been diagnosed with hypertension but not yet started on medications.  He otherwise denies any history of tobacco use, illicit substances, or family history of significant coronary disease.  He does report drinking approximately 5-6 shots on the weekends.  Over the past couple of weeks he has noted increased fatigue and dyspnea which he attributed to being overweight with deconditioning.  He was in his usual state of health up until 3 days prior when he began to experience substernal chest pressure with exertion that was initially intermittent.  However, in the early morning hours of 5/10 he again developed midsternal chest pressure at rest and was rated a 10 out of 10 without radiation.  Symptoms were associated with shortness of breath, nausea, and diaphoresis.  Symptoms lasted approximately 45 minutes and were relieved with 1 sublingual nitroglycerin upon EMS arrival.  Upon his arrival to [facility] ED he was noted to be hypertensive with a BP of 186/97, initial high-sensitivity troponin of 67 with a delta and peak of 82.  BNP 10.  D-dimer negative.  Lipase normal.  Glucose 279 with an A1c of 7.0.  Direct LDL 84.  Chest x-ray without acute cardiopulmonary  disease.  EKG demonstrated sinus rhythm with nonspecific inferior ST-T changes and anterolateral T wave inversion.  He received aspirin in the ED along with multiple doses of IV labetalol.  He was consulted on by [facility] cardiology and underwent diagnostic LHC on 04/07/2020 which showed 80% proximal to mid LAD stenosis, mid LAD 50% stenosis, D1 90% stenosis, OM1 35% stenosis, proximal RCA 45% stenosis, mid RCA 50% stenosis, LVEF 60%.  Case was discussed with interventional cardiology with recommendation to transfer the patient to [facility] for coronary atherectomy/PCI which has been scheduled for 04/08/2020.  At time of H&P patient is chest pain-free with BP persisting in the 694W systolic.   Past Medical History:  Diagnosis Date  . Asthma   . CAD (coronary artery disease)    a. NSTEMI 04/07/20 - LHC 80% p-mLAD, mLAD 50%, D1 90%, OM1 35, pRCA 45%, mRCA 50%, EF 60%  . Essential hypertension   . Hyperlipidemia LDL goal <70   . Obesity     Past Surgical History:  Procedure Laterality Date  . NO PAST SURGERIES       Medications Prior to Admission: Prior to Admission medications   Medication Sig Start Date End Date Taking? Authorizing Provider  ADVAIR DISKUS 250-50 MCG/DOSE AEPB Inhale 1 puff into the lungs 2 (two) times daily. 11/02/19   [provider]  albuterol (VENTOLIN HFA) 108 (90 Base) MCG/ACT inhaler Inhale 2 puffs into the lungs every 6 (six) hours as needed for wheezing or shortness of breath. 11/15/19   Sable Feil, PA-C  atorvastatin (LIPITOR) 20 MG  tablet Take 20 mg by mouth daily. 04/01/20 04/01/21  [provider]  escitalopram (LEXAPRO) 5 MG tablet Take 5 mg by mouth daily.    [provider]  mirtazapine (REMERON) 30 MG tablet Take 30 mg by mouth at bedtime.    [provider]     Allergies:   No Known Allergies  Social History:   Social History   Socioeconomic History  . Marital status: Single    Spouse name: Not on file  . Number of children:  Not on file  . Years of education: Not on file  . Highest education level: Not on file  Occupational History  . Not on file  Tobacco Use  . Smoking status: Never Smoker  . Smokeless tobacco: Never Used  Substance and Sexual Activity  . Alcohol use: Yes    Comment: occasionally  . Drug use: No  . Sexual activity: Not on file  Other Topics Concern  . Not on file  Social History Narrative  . Not on file   Social Determinants of Health   Financial Resource Strain:   . Difficulty of Paying Living Expenses:   Food Insecurity:   . Worried About Programme researcher, broadcasting/film/video in the Last Year:   . Barista in the Last Year:   Transportation Needs:   . Freight forwarder (Medical):   Marland Kitchen Lack of Transportation (Non-Medical):   Physical Activity:   . Days of Exercise per Week:   . Minutes of Exercise per Session:   Stress:   . Feeling of Stress :   Social Connections:   . Frequency of Communication with Friends and Family:   . Frequency of Social Gatherings with Friends and Family:   . Attends Religious Services:   . Active Member of Clubs or Organizations:   . Attends Banker Meetings:   Marland Kitchen Marital Status:   Intimate Partner Violence:   . Fear of Current or Ex-Partner:   . Emotionally Abused:   Marland Kitchen Physically Abused:   . Sexually Abused:     Family History:   The patient's family history includes Diabetes in his maternal grandmother; Healthy in his father; Hypertension in his mother.    ROS:  Please see the history of present illness.  All other ROS reviewed and negative.     Physical Exam/Data:  Blood pressure (!) 188/117, pulse 78, temperature 98.5 F (36.9 C), temperature source Oral, resp. rate 20, height 5\' 10"  (1.778 m), weight 81.6 kg, SpO2 98 %. Body mass index is 25.83 kg/m.  General:  Well nourished, well developed, in no acute distress HEENT: Normal Lymph: No adenopathy Neck: No JVD Endocrine:  No thryomegaly Vascular: No carotid bruits; FA  pulses 2+ bilaterally without bruits  Cardiac:  Normal S1, S2; RRR; no murmur.  TR band in place along the right wrist. Lungs:  Clear to auscultation bilaterally, no wheezing, rhonchi or rales  Abd: Soft, nontender, no hepatomegaly  Ext: No edema Musculoskeletal:  No deformities, BUE and BLE strength normal and equal Skin: Warm and dry  Neuro:  CNs 2-12 intact, no focal abnormalities noted Psych:  Normal affect    EKG:  The ECG that was done 04/07/2020 was personally reviewed and demonstrates NSR, 70 bpm, nonspecific inferior st/t changes, anterolateral TWI  Relevant CV Studies:  LHC 04/07/2020:  Prox RCA lesion is 45% stenosed.  Mid RCA lesion is 50% stenosed.  1st Mrg lesion is 35% stenosed.  Prox LAD to Mid LAD  lesion is 80% stenosed.  1st Diag lesion is 90% stenosed.  Mid LAD lesion is 50% stenosed.   49 year old male with borderline hypertension hyperlipidemia and family history of cardiovascular disease with progressive anginal symptoms and now chest pain at rest or with elevated troponin consistent with non-ST elevation myocardial infarction  LV function with normal ejection fraction 60%  Moderate atherosclerosis of 2 vessels and significant atherosclerosis of proximal left anterior descending artery 80% consistent with culprit lesion for non-ST elevation myocardial infarction  Plan PCI and stent placement with atherectomy of left anterior descending artery Dual antiplatelet therapy High intensity cholesterol therapy Beta-blocker ACE inhibitor for hypertension control and non-ST elevation myocardial infarction Cardiac rehabilitation __________  Diagnostic Dominance: Right Left Anterior Descending  Prox LAD to Mid LAD lesion 80% stenosed  Prox LAD to Mid LAD lesion is 80% stenosed.  Mid LAD lesion 50% stenosed  Mid LAD lesion is 50% stenosed.  First Diagonal Branch  1st Diag lesion 90% stenosed  1st Diag lesion is 90% stenosed.  Left Circumflex  First  Obtuse Marginal Branch  1st Mrg lesion 35% stenosed  1st Mrg lesion is 35% stenosed.  Right Coronary Artery  Prox RCA lesion 45% stenosed  Prox RCA lesion is 45% stenosed.  Mid RCA lesion 50% stenosed  Mid RCA lesion is 50% stenosed.  Intervention  No interventions have been documented. Wall Motion  Resting               Coronary Diagrams  Diagnostic Dominance: Right    Laboratory Data:  High Sensitivity Troponin:   Recent Labs  Lab 04/07/20 0051 04/07/20 0242 04/07/20 0643 04/07/20 0823  TROPONINIHS 67* 82* 73* 62*      Chemistry Recent Labs  Lab 04/07/20 0051 04/07/20 0643  NA 135 137  K 3.9 3.7  CL 101 102  CO2 24 27  GLUCOSE 279* 164*  BUN 11 10  CREATININE 0.93 0.93  CALCIUM 8.8* 8.7*  GFRNONAA >60 >60  GFRAA >60 >60  ANIONGAP 10 8    Recent Labs  Lab 04/07/20 0643  PROT 7.5  ALBUMIN 4.0  AST 23  ALT 24  ALKPHOS 55  BILITOT 0.5   Hematology Recent Labs  Lab 04/07/20 0001  WBC 5.0  RBC 4.80  HGB 14.3  HCT 41.2  MCV 85.8  MCH 29.8  MCHC 34.7  RDW 13.5  PLT 214   BNP Recent Labs  Lab 04/07/20 0643  BNP 10.0    DDimer No results for input(s): DDIMER in the last 168 hours.   Radiology/Studies:  DG Chest 2 View  Result Date: 04/07/2020 IMPRESSION: No active cardiopulmonary disease. Electronically Signed   By: Elgie Collard M.D.   On: 04/07/2020 00:30        TIMI Risk Score for Unstable Angina or Non-ST Elevation MI:   The patient's TIMI risk score is 5, which indicates a 26% risk of all cause mortality, new or recurrent myocardial infarction or need for urgent revascularization in the next 14 days.   Assessment and Plan:   1. NSTEMI: Currently chest pain-free.  High-sensitivity troponin peaked at 82 and was subsequently downtrending.  Diagnostic LHC showed high-grade LAD and diagonal disease as outlined above with recommendation for transfer to [facility] for atherectomy/PCI which has been scheduled for 04/08/2020.   Restart heparin drip 8 hours after sheath pull which will be at approximately 12:35 AM on 04/08/2020.  This was discussed with the patient's cardiologist at [facility] and with interventional cardiology within [facility].  Place patient on DAPT with loading dose of Effient (no prior stroke) as well as aspirin 81 mg daily.  Check echo.  Continue high-dose atorvastatin 80 mg daily as outlined below along with metoprolol.  N.p.o. at midnight.  Risks and benefits of cardiac catheterization have been discussed with the patient including risks of bleeding, bruising, infection, kidney damage, stroke, heart attack, urgent need for bypass, injury to a limb, and death. The patient understands these risks and is willing to proceed with the procedure. All questions have been answered and concerns listened to.   2. Newly diagnosed diabetes: A1c 7.0 this admission.  SSI for now.  Consider diabetic counselor consult at [facility]  He will need to follow-up with PCP as an outpatient.  3. Hypertensive urgency: Add amlodipine 5 mg daily with recommendation to titrate as indicated.  Continue current dose metoprolol.  Low-sodium and heart healthy diet recommended.  4. Hyperlipidemia: Direct LDL of 84 this admission with goal being less than 70.  He was on atorvastatin 20 mg daily prior to admission which has been titrated to 80 mg daily.  Recommend follow-up fasting lipid panel and liver function in approximately 8 weeks.  If his LDL remains above goal at that time recommend addition of Zetia 10 mg daily.  5. Obesity: Weight loss advised.  6. Depression: PTA Lexapro and Remeron.  7. Asthma: PTA Advair and albuterol.  Severity of Illness: The appropriate patient status for this patient is OBSERVATION. Observation status is judged to be reasonable and necessary in order to provide the required intensity of service to ensure the patient's safety. The patient's presenting symptoms, physical exam findings, and initial  radiographic and laboratory data in the context of their medical condition is felt to place them at decreased risk for further clinical deterioration. Furthermore, it is anticipated that the patient will be medically stable for discharge from the hospital within 2 midnights of admission. The following factors support the patient status of observation.   " The patient's presenting symptoms include as above. " The physical exam findings include as above. " The initial radiographic and laboratory data are as above.     For questions or updates, please contact [facility] Please consult [email or web address] for contact info under        Signed, Eula Listen, PA-C  04/07/2020 5:34 PM

## 2020-04-07 NOTE — ED Notes (Signed)
Pt states chest pain with shortness of breath on exertion. Pt states he has had it over the last several years, but it has been getting worse.

## 2020-04-07 NOTE — ED Notes (Signed)
Per [facility], they have no techs or RN's who can provide pt transport

## 2020-04-07 NOTE — Progress Notes (Signed)
[facility] Cardiology [facility] Encounter Note  Patient: Oscar Givens Sr. / Admit Date: 04/07/2020 / Date of Encounter: 04/07/2020, 4:23 PM   Subjective: Patient had full resolution of chest discomfort.  Troponin elevated consistent with minimal non-ST elevation myocardial infarction Cardiac catheterization showing normal LV systolic function with ejection fraction of 65% Moderate two-vessel disease here with significant proximal left anterior descending artery stenosis with calcifications and stenosis of 80% likely culprit lesion for symptoms  Review of Systems: Positive for: Shortness of breath Negative for: Vision change, hearing change, syncope, dizziness, nausea, vomiting,diarrhea, bloody stool, stomach pain, cough, congestion, diaphoresis, urinary frequency, urinary pain,skin lesions, skin rashes Others previously listed  Objective: Telemetry: Normal sinus rhythm Physical Exam: Blood pressure (!) 188/117, pulse 78, temperature 98.5 F (36.9 C), temperature source Oral, resp. rate 20, height 5\' 10"  (1.778 m), weight 81.6 kg, SpO2 98 %. Body mass index is 25.83 kg/m. General: Well developed, well nourished, in no acute distress. Head: Normocephalic, atraumatic, sclera non-icteric, no xanthomas, nares are without discharge. Neck: No apparent masses Lungs: Normal respirations with no wheezes, no rhonchi, no rales , no crackles   Heart: Regular rate and rhythm, normal S1 S2, no murmur, no rub, no gallop, PMI is normal size and placement, carotid upstroke normal without bruit, jugular venous pressure normal Abdomen: Soft, non-tender, non-distended with normoactive bowel sounds. No hepatosplenomegaly. Abdominal aorta is normal size without bruit Extremities: No edema, no clubbing, no cyanosis, no ulcers,  Peripheral: 2+ radial, 2+ femoral, 2+ dorsal pedal pulses Neuro: Alert and oriented. Moves all extremities spontaneously. Psych:  Responds to questions appropriately with a normal  affect.  No intake or output data in the 24 hours ending 04/07/20 1623  Inpatient Medications:  . [START ON 04/08/2020] aspirin  81 mg Oral Pre-Cath  . [MAR Hold] aspirin EC  81 mg Oral Daily  . [MAR Hold] atorvastatin  40 mg Oral Daily  . [MAR Hold] escitalopram  5 mg Oral Daily  . [MAR Hold] insulin aspart  0-15 Units Subcutaneous Q6H  . [MAR Hold] metoprolol succinate  25 mg Oral Daily  . [MAR Hold] mirtazapine  30 mg Oral QHS  . [MAR Hold] sodium chloride flush  3 mL Intravenous Q12H   Infusions:  . sodium chloride    . [START ON 04/08/2020] sodium chloride 3 mL/kg/hr (04/07/20 1411)   Followed by  . [START ON 04/08/2020] sodium chloride    . heparin 1,300 Units/hr (04/07/20 0952)    Labs: Recent Labs    04/07/20 0051 04/07/20 0643  NA 135 137  K 3.9 3.7  CL 101 102  CO2 24 27  GLUCOSE 279* 164*  BUN 11 10  CREATININE 0.93 0.93  CALCIUM 8.8* 8.7*  MG  --  2.4   Recent Labs    04/07/20 0643  AST 23  ALT 24  ALKPHOS 55  BILITOT 0.5  PROT 7.5  ALBUMIN 4.0   Recent Labs    04/07/20 0001  WBC 5.0  HGB 14.3  HCT 41.2  MCV 85.8  PLT 214   No results for input(s): CKTOTAL, CKMB, TROPONINI in the last 72 hours. Invalid input(s): POCBNP Recent Labs    04/07/20 0643  HGBA1C 7.0*     Weights: Filed Weights   04/06/20 2358 04/07/20 1348  Weight: 81.6 kg 81.6 kg     Radiology/Studies:  DG Chest 2 View  Result Date: 04/07/2020 CLINICAL DATA:  49 year old male with chest pain. EXAM: CHEST - 2 VIEW COMPARISON:  Chest radiograph  dated 11/15/2019. FINDINGS: The heart size and mediastinal contours are within normal limits. Both lungs are clear. The visualized skeletal structures are unremarkable. IMPRESSION: No active cardiopulmonary disease. Electronically Signed   By: Anner Crete M.D.   On: 04/07/2020 00:30     Assessment and Recommendation  49 y.o. male with hypertension hyperlipidemia family history of cardiovascular disease with non-ST elevation  myocardial infarction 1.  PCI and stent placement of left anterior descending artery although with further evaluation patient should have atherectomy of as well 2.  Dual antiplatelet therapy 3.  High intensity cholesterol therapy 4.  Beta-blocker ACE inhibitor for hypertension control 5.  Further cardiac rehabilitation  Signed, Serafina Royals M.D. FACC

## 2020-04-07 NOTE — H&P (Signed)
[facility] [place] NAME: Oscar Norris    MR#:  [id number]  DATE OF BIRTH:  14-May-1971  DATE OF ADMISSION:  04/07/2020  Norris CARE PHYSICIAN: Oscar Norris   REQUESTING/REFERRING PHYSICIAN: Derrell Lolling, MD  CHIEF COMPLAINT:   Chief Complaint  Patient presents with  . Chest Pain    HISTORY OF PRESENT ILLNESS:  Oscar Norris  is a 49 y.o. African-American male with a known history of asthma, dyslipidemia and depression, who presented to the emergency room with acute onset of midsternal chest pain felt as pressure and graded 10/10 in severity with radiation to his back and associated diaphoresis as well as mild dyspnea without nausea or vomiting.  He denied any leg pain or edema or recent travels or surgeries.  He has been having occasional back pain.  No dysuria, oliguria or hematuria or flank pain.  No bleeding diathesis.  Upon presentation to the emergency room, blood pressure was 186/97 with otherwise normal vital signs.  Labs revealed hyperglycemia of 279 and high-sensitivity troponin I of 67 with normal fibrin derivatives  D-dimer.  Two-view chest x-ray showed no acute cardiopulmonary disease. EKG showed normal sinus rhythm with rate of 70, possible left atrial enlargement and T wave version V1 through V6.  The patient was given aspirin by EMS and 1 sublingual nitroglycerin with relief of her pain.  She will be admitted to an observation in cardiac progressive unit for further evaluation and management.   PAST MEDICAL HISTORY:   Past Medical History:  Diagnosis Date  . Asthma   Dyslipidemia and depression  PAST SURGICAL HISTORY:   Past Surgical History:  Procedure Laterality Date  . NO PAST SURGERIES    He denies any previous surgeries.  SOCIAL HISTORY:   Social History   Tobacco Use  . Smoking status: Never Smoker  . Smokeless tobacco: Never Used  Substance Use Topics  . Alcohol use: Yes    Comment: occasionally    FAMILY  HISTORY:   Family History  Problem Relation Age of Onset  . Healthy Mother   . Healthy Father    No reported familial diseases. DRUG ALLERGIES:  No Known Allergies  REVIEW OF SYSTEMS:   ROS As per history of present illness. All pertinent systems were reviewed above. Constitutional,  HEENT, cardiovascular, respiratory, GI, GU, musculoskeletal, neuro, psychiatric, endocrine,  integumentary and hematologic systems were reviewed and are otherwise  negative/unremarkable except for positive findings mentioned above in the HPI.   MEDICATIONS AT HOME:   Prior to Admission medications   Medication Sig Start Date End Date Taking? Authorizing Provider  atorvastatin (LIPITOR) 20 MG tablet Take 20 mg by mouth daily. 04/01/20 04/01/21 Yes [provider]  ADVAIR DISKUS 250-50 MCG/DOSE AEPB Inhale 1 puff into the lungs 2 (two) times daily. 11/02/19   [provider]  albuterol (VENTOLIN HFA) 108 (90 Base) MCG/ACT inhaler Inhale 2 puffs into the lungs every 6 (six) hours as needed for wheezing or shortness of breath. 11/15/19   Sable Feil, PA-C  escitalopram (LEXAPRO) 5 MG tablet Take 5 mg by mouth daily.    [provider]  mirtazapine (REMERON) 30 MG tablet Take 30 mg by mouth at bedtime.    [provider]      VITAL SIGNS:  Blood pressure (!) 184/92, pulse 71, temperature 98.6 F (37 C), temperature source Oral, resp. rate 17, height 5\' 10"  (1.778 m), weight 81.6 kg, SpO2 98 %.  PHYSICAL EXAMINATION:  Physical Exam  GENERAL:  49 y.o.-year-old patient lying in the bed with no acute distress.  EYES: Pupils equal, round, reactive to light and accommodation. No scleral icterus. Extraocular muscles intact.  HEENT: Head atraumatic, normocephalic. Oropharynx and nasopharynx clear.  NECK:  Supple, no jugular venous distention. No thyroid enlargement, no tenderness.  LUNGS: Normal breath sounds bilaterally, no wheezing, rales,rhonchi or crepitation. No use  of accessory muscles of respiration.  CARDIOVASCULAR: Regular rate and rhythm, S1, S2 normal. No murmurs, rubs, or gallops.  ABDOMEN: Soft, nondistended, nontender. Bowel sounds present. No organomegaly or mass.  EXTREMITIES: No pedal edema, cyanosis, or clubbing.  NEUROLOGIC: Cranial nerves II through XII are intact. Muscle strength 5/5 in all extremities. Sensation intact. Gait not checked.  PSYCHIATRIC: The patient is alert and oriented x 3.  Normal affect and good eye contact. SKIN: No obvious rash, lesion, or ulcer.   LABORATORY PANEL:   CBC Recent Labs  Lab 04/07/20 0001  WBC 5.0  HGB 14.3  HCT 41.2  PLT 214   ------------------------------------------------------------------------------------------------------------------  Chemistries  Recent Labs  Lab 04/07/20 0051  NA 135  K 3.9  CL 101  CO2 24  GLUCOSE 279*  BUN 11  CREATININE 0.93  CALCIUM 8.8*   ------------------------------------------------------------------------------------------------------------------  Cardiac Enzymes No results for input(s): TROPONINI in the last 168 hours. ------------------------------------------------------------------------------------------------------------------  RADIOLOGY:  DG Chest 2 View  Result Date: 04/07/2020 CLINICAL DATA:  49 year old male with chest pain. EXAM: CHEST - 2 VIEW COMPARISON:  Chest radiograph dated 11/15/2019. FINDINGS: The heart size and mediastinal contours are within normal limits. Both lungs are clear. The visualized skeletal structures are unremarkable. IMPRESSION: No active cardiopulmonary disease. Electronically Signed   By: Elgie Collard M.D.   On: 04/07/2020 00:30      IMPRESSION AND PLAN:  1.  Chest pain, rule out acute coronary syndrome. -The patient will be admitted to an observation telemetry bed. -We will follow serial troponin I's. -The patient will be placed on aspirin as well as as needed sublingual nitroglycerin and morphine  sulfate for pain. -Cardiology consult will be obtained in a.m. -I notified Dr. Gwen Pounds about the patient.  2.  Hypertensive urgency. -The patient will be placed on as needed IV labetalol.  3.  Dyslipidemia. -We will resume her on statin therapy.  4.  Hyperglycemia, likely with new onset diabetes mellitus. -We will check hemoglobin A1c level. -We will place the patient on supplemental coverage with NovoLog.  5.  Depression. -We will continue Lexapro and Remeron.  6.  Asthma.  He has no current exacerbation. -We will place the patient on scheduled and as needed duo nebs and hold off Advair Diskus.  7.  DVT prophylaxis. -Subcutaneous Lovenox  All the records are reviewed and case discussed with ED provider. The plan of care was discussed in details with the patient (and family). I answered all questions. The patient agreed to proceed with the above mentioned plan. Further management will depend upon hospital course.   CODE STATUS: Full code  Status is: Observation  The patient remains OBS appropriate and will d/c before 2 midnights.  Dispo: The patient is from: Home              Anticipated d/c is to: Home              Anticipated d/c date is: 1 day              Patient currently is not medically stable to d/c.  TOTAL TIME TAKING  CARE OF THIS PATIENT: 55 minutes.    Hannah Beat M.D on 04/07/2020 at 2:35 AM  [facility]   From 7 PM-7 AM, contact night-coverage [email or web address]  CC: Norris care physician; Oscar Norris   Note: This dictation was prepared with Dragon dictation along with smaller phrase technology. Any transcriptional errors that result from this process are unintentional.

## 2020-04-07 NOTE — Progress Notes (Signed)
ANTICOAGULATION CONSULT NOTE - Initial Consult  Pharmacy Consult for heparin Indication: chest pain/ACS  No Known Allergies  Patient Measurements: Height: 5\' 10"  (177.8 cm) Weight: 81.6 kg (180 lb) IBW/kg (Calculated) : 73 Heparin Dosing Weight: 81.6 kg  Vital Signs: Temp: 98.6 F (37 C) (05/09 2357) Temp Source: Oral (05/09 2357) BP: 184/92 (05/10 0200) Pulse Rate: 71 (05/10 0200)  Labs: Recent Labs    04/07/20 0001 04/07/20 0051  HGB 14.3  --   HCT 41.2  --   PLT 214  --   CREATININE  --  0.93  TROPONINIHS  --  67*    Estimated Creatinine Clearance: 99.2 mL/min (by C-G formula based on SCr of 0.93 mg/dL).   Medical History: Past Medical History:  Diagnosis Date  . Asthma     Medications:  Scheduled:  . heparin  4,000 Units Intravenous Once    Assessment: Patient admitted x CP that radiates to mid-chest area w/ h/o asthma, depression, HTN, HLD presenting to the ED w/ trops up to 67 (repeats pending), EKG showing non-specific T wave abnormalities, no anticoagulation on file, baseline CBC WNL, STAT aPTT/INR pending. Patient is being started on heparin drip for management of NSTEMI.  Goal of Therapy:  Heparin level 0.3-0.7 units/ml Monitor platelets by anticoagulation protocol: Yes   Plan:  Will bolus w/ heparin 4000 units IV x 1 Will start rate at 1150 units/hr Will check anti-Xa at 0800. Will monitor daily CBC's and adjust per anti-Xa levels.  06/07/20, PharmD, BCPS Clinical Pharmacist 04/07/2020,2:19 AM

## 2020-04-07 NOTE — ED Notes (Signed)
Provider given 6am EKG and notified that there are no orders for repeat/seiral EKGs. Provider is reviewing chart at this time

## 2020-04-07 NOTE — H&P (View-Only) (Signed)
[facility] Cardiology [facility] Encounter Note  Patient: Oscar Givens Sr. / Admit Date: 04/07/2020 / Date of Encounter: 04/07/2020, 4:23 PM   Subjective: Patient had full resolution of chest discomfort.  Troponin elevated consistent with minimal non-ST elevation myocardial infarction Cardiac catheterization showing normal LV systolic function with ejection fraction of 65% Moderate two-vessel disease here with significant proximal left anterior descending artery stenosis with calcifications and stenosis of 80% likely culprit lesion for symptoms  Review of Systems: Positive for: Shortness of breath Negative for: Vision change, hearing change, syncope, dizziness, nausea, vomiting,diarrhea, bloody stool, stomach pain, cough, congestion, diaphoresis, urinary frequency, urinary pain,skin lesions, skin rashes Others previously listed  Objective: Telemetry: Normal sinus rhythm Physical Exam: Blood pressure (!) 188/117, pulse 78, temperature 98.5 F (36.9 C), temperature source Oral, resp. rate 20, height 5\' 10"  (1.778 m), weight 81.6 kg, SpO2 98 %. Body mass index is 25.83 kg/m. General: Well developed, well nourished, in no acute distress. Head: Normocephalic, atraumatic, sclera non-icteric, no xanthomas, nares are without discharge. Neck: No apparent masses Lungs: Normal respirations with no wheezes, no rhonchi, no rales , no crackles   Heart: Regular rate and rhythm, normal S1 S2, no murmur, no rub, no gallop, PMI is normal size and placement, carotid upstroke normal without bruit, jugular venous pressure normal Abdomen: Soft, non-tender, non-distended with normoactive bowel sounds. No hepatosplenomegaly. Abdominal aorta is normal size without bruit Extremities: No edema, no clubbing, no cyanosis, no ulcers,  Peripheral: 2+ radial, 2+ femoral, 2+ dorsal pedal pulses Neuro: Alert and oriented. Moves all extremities spontaneously. Psych:  Responds to questions appropriately with a normal  affect.  No intake or output data in the 24 hours ending 04/07/20 1623  Inpatient Medications:  . [START ON 04/08/2020] aspirin  81 mg Oral Pre-Cath  . [MAR Hold] aspirin EC  81 mg Oral Daily  . [MAR Hold] atorvastatin  40 mg Oral Daily  . [MAR Hold] escitalopram  5 mg Oral Daily  . [MAR Hold] insulin aspart  0-15 Units Subcutaneous Q6H  . [MAR Hold] metoprolol succinate  25 mg Oral Daily  . [MAR Hold] mirtazapine  30 mg Oral QHS  . [MAR Hold] sodium chloride flush  3 mL Intravenous Q12H   Infusions:  . sodium chloride    . [START ON 04/08/2020] sodium chloride 3 mL/kg/hr (04/07/20 1411)   Followed by  . [START ON 04/08/2020] sodium chloride    . heparin 1,300 Units/hr (04/07/20 0952)    Labs: Recent Labs    04/07/20 0051 04/07/20 0643  NA 135 137  K 3.9 3.7  CL 101 102  CO2 24 27  GLUCOSE 279* 164*  BUN 11 10  CREATININE 0.93 0.93  CALCIUM 8.8* 8.7*  MG  --  2.4   Recent Labs    04/07/20 0643  AST 23  ALT 24  ALKPHOS 55  BILITOT 0.5  PROT 7.5  ALBUMIN 4.0   Recent Labs    04/07/20 0001  WBC 5.0  HGB 14.3  HCT 41.2  MCV 85.8  PLT 214   No results for input(s): CKTOTAL, CKMB, TROPONINI in the last 72 hours. Invalid input(s): POCBNP Recent Labs    04/07/20 0643  HGBA1C 7.0*     Weights: Filed Weights   04/06/20 2358 04/07/20 1348  Weight: 81.6 kg 81.6 kg     Radiology/Studies:  DG Chest 2 View  Result Date: 04/07/2020 CLINICAL DATA:  49 year old male with chest pain. EXAM: CHEST - 2 VIEW COMPARISON:  Chest radiograph  dated 11/15/2019. FINDINGS: The heart size and mediastinal contours are within normal limits. Both lungs are clear. The visualized skeletal structures are unremarkable. IMPRESSION: No active cardiopulmonary disease. Electronically Signed   By: Arash  Radparvar M.D.   On: 04/07/2020 00:30     Assessment and Recommendation  49 y.o. male with hypertension hyperlipidemia family history of cardiovascular disease with non-ST elevation  myocardial infarction 1.  PCI and stent placement of left anterior descending artery although with further evaluation patient should have atherectomy of as well 2.  Dual antiplatelet therapy 3.  High intensity cholesterol therapy 4.  Beta-blocker ACE inhibitor for hypertension control 5.  Further cardiac rehabilitation  Signed, Robynn Marcel M.D. FACC 

## 2020-04-07 NOTE — Consult Note (Signed)
[facility] Consultation Note  Patient ID: Oscar Parlee., MRN: [id number], DOB/AGE: 03/16/1971 49 y.o. Admit date: 04/07/2020   Date of Consult: 04/07/2020 Primary Physician: [facility], [place] [facility] Primary Cardiologist: None  Chief Complaint:  Chief Complaint  Patient presents with  . Chest Pain   Reason for Consult: Chest pain  HPI: 49 y.o. male with hypertension and borderline hyperlipidemia having significant progression of issues of shortness of breath and chest pain with physical activity now worsening to the point where the patient had episodes while at rest.  The chest pain is substernal in nature radiating into the back with shortness of breath and no other radiation.  This has been relieved with nitrates and rest and heparin.  The patient did have an EKG showing normal sinus rhythm with anterior T wave inversion and a troponin level of 73.  Currently he is placed on heparin with no further hemodynamic instability and feels fairly well at this time  Past Medical History:  Diagnosis Date  . Asthma       Surgical History:  Past Surgical History:  Procedure Laterality Date  . NO PAST SURGERIES       Home Meds: Prior to Admission medications   Medication Sig Start Date End Date Taking? Authorizing Provider  ADVAIR DISKUS 250-50 MCG/DOSE AEPB Inhale 1 puff into the lungs 2 (two) times daily. 11/02/19  Yes [provider]  albuterol (VENTOLIN HFA) 108 (90 Base) MCG/ACT inhaler Inhale 2 puffs into the lungs every 6 (six) hours as needed for wheezing or shortness of breath. 11/15/19  Yes Sable Feil, PA-C  atorvastatin (LIPITOR) 20 MG tablet Take 20 mg by mouth daily. 04/01/20 04/01/21 Yes [provider]  escitalopram (LEXAPRO) 5 MG tablet Take 5 mg by mouth daily.   Yes [provider]  mirtazapine (REMERON) 30 MG tablet Take 30 mg by mouth at bedtime.   Yes [provider]    Inpatient Medications:  . aspirin EC  81 mg Oral  Daily  . atorvastatin  40 mg Oral Daily  . escitalopram  5 mg Oral Daily  . insulin aspart  0-15 Units Subcutaneous Q6H  . metoprolol succinate  25 mg Oral Daily  . mirtazapine  30 mg Oral QHS  . sodium chloride flush  3 mL Intravenous Q12H   . heparin 1,150 Units/hr (04/07/20 0248)    Allergies: No Known Allergies  Social History   Socioeconomic History  . Marital status: Single    Spouse name: Not on file  . Number of children: Not on file  . Years of education: Not on file  . Highest education level: Not on file  Occupational History  . Not on file  Tobacco Use  . Smoking status: Never Smoker  . Smokeless tobacco: Never Used  Substance and Sexual Activity  . Alcohol use: Yes    Comment: occasionally  . Drug use: No  . Sexual activity: Not on file  Other Topics Concern  . Not on file  Social History Narrative  . Not on file   Social Determinants of Health   Financial Resource Strain:   . Difficulty of Paying Living Expenses:   Food Insecurity:   . Worried About Charity fundraiser in the Last Year:   . Arboriculturist in the Last Year:   Transportation Needs:   . Film/video editor (Medical):   Marland Kitchen Lack of Transportation (Non-Medical):   Physical Activity:   . Days of  Exercise per Week:   . Minutes of Exercise per Session:   Stress:   . Feeling of Stress :   Social Connections:   . Frequency of Communication with Friends and Family:   . Frequency of Social Gatherings with Friends and Family:   . Attends Religious Services:   . Active Member of Clubs or Organizations:   . Attends Banker Meetings:   Marland Kitchen Marital Status:   Intimate Partner Violence:   . Fear of Current or Ex-Partner:   . Emotionally Abused:   Marland Kitchen Physically Abused:   . Sexually Abused:      Family History  Problem Relation Age of Onset  . Healthy Mother   . Healthy Father      Review of Systems Positive for chest pain shortness of breath Negative for: General:   chills, fever, night sweats or weight changes.  Cardiovascular: PND orthopnea syncope dizziness  Dermatological skin lesions rashes Respiratory: Cough congestion Urologic: Frequent urination urination at night and hematuria Abdominal: negative for nausea, vomiting, diarrhea, bright red blood per rectum, melena, or hematemesis Neurologic: negative for visual changes, and/or hearing changes  All other systems reviewed and are otherwise negative except as noted above.  Labs: No results for input(s): CKTOTAL, CKMB, TROPONINI in the last 72 hours. Lab Results  Component Value Date   WBC 5.0 04/07/2020   HGB 14.3 04/07/2020   HCT 41.2 04/07/2020   MCV 85.8 04/07/2020   PLT 214 04/07/2020    Recent Labs  Lab 04/07/20 0643  NA 137  K 3.7  CL 102  CO2 27  BUN 10  CREATININE 0.93  CALCIUM 8.7*  PROT 7.5  BILITOT 0.5  ALKPHOS 55  ALT 24  AST 23  GLUCOSE 164*   Lab Results  Component Value Date   CHOL 198 04/07/2020   HDL 35 (L) 04/07/2020   LDLCALC UNABLE TO CALCULATE IF TRIGLYCERIDE OVER 400 mg/dL 31/54/0086   TRIG 761 (H) 04/07/2020   No results found for: DDIMER  Radiology/Studies:  DG Chest 2 View  Result Date: 04/07/2020 CLINICAL DATA:  49 year old male with chest pain. EXAM: CHEST - 2 VIEW COMPARISON:  Chest radiograph dated 11/15/2019. FINDINGS: The heart size and mediastinal contours are within normal limits. Both lungs are clear. The visualized skeletal structures are unremarkable. IMPRESSION: No active cardiopulmonary disease. Electronically Signed   By: Elgie Collard M.D.   On: 04/07/2020 00:30    EKG: Normal sinus rhythm with anterior T wave inversion  Weights: Filed Weights   04/06/20 2358  Weight: 81.6 kg     Physical Exam: Blood pressure (!) 175/121, pulse 70, temperature 98.6 F (37 C), temperature source Oral, resp. rate 15, height 5\' 10"  (1.778 m), weight 81.6 kg, SpO2 99 %. Body mass index is 25.83 kg/m. General: Well developed, well  nourished, in no acute distress. Head eyes ears nose throat: Normocephalic, atraumatic, sclera non-icteric, no xanthomas, nares are without discharge. No apparent thyromegaly and/or mass  Lungs: Normal respiratory effort.  no wheezes, no rales, no rhonchi.  Heart: RRR with normal S1 S2. no murmur gallop, no rub, PMI is normal size and placement, carotid upstroke normal without bruit, jugular venous pressure is normal Abdomen: Soft, non-tender, non-distended with normoactive bowel sounds. No hepatomegaly. No rebound/guarding. No obvious abdominal masses. Abdominal aorta is normal size without bruit Extremities: No edema. no cyanosis, no clubbing, no ulcers  Peripheral : 2+ bilateral upper extremity pulses, 2+ bilateral femoral pulses, 2+ bilateral dorsal pedal pulse Neuro: Alert  and oriented. No facial asymmetry. No focal deficit. Moves all extremities spontaneously. Musculoskeletal: Normal muscle tone without kyphosis Psych:  Responds to questions appropriately with a normal affect.    Assessment: 49 year old male with hypertension borderline hyperlipidemia having acute onset of chest discomfort with elevated troponin of 73 and abnormal EKG consistent with non-ST elevation myocardial infarction  Plan: 1.  Heparin and aspirin for further risk reduction of myocardial infarction 2.  Blood pressure control with metoprolol 3.  Isosorbide for further risk reduction of chest pain 4.  Echocardiogram for LV systolic dysfunction valvular heart disease contributing to above 5.  Proceed to cardiac catheterization to assess coronary anatomy and further treatment thereof is necessary.  Patient understands the risk and benefits of cardiac catheterization.  This includes the possibility of death stroke heart attack infection bleeding or blood clot.  He is at low risk for conscious sedation  Signed, Lamar Blinks M.D. [facility] Cardiology 04/07/2020, 8:50 AM

## 2020-04-07 NOTE — ED Provider Notes (Signed)
[facility] Emergency Department Provider Note  ____________________________________________   First MD Initiated Contact with Patient 04/07/20 [phone]     (approximate)  I have reviewed the triage vital signs and the nursing notes.  History  Chief Complaint Chest Pain    HPI Oscar Norris Sr. is a 49 y.o. male w/ hx of asthma, HTN brought in by EMS for chest pain.  Patient reports anterior chest pain, which has been occurring intermittently since Friday.  Describes it as if someone is pushing into his chest.  Currently moderate in severity.  Symptoms worsen with exertion.  Also associated with shortness of breath, heart pounding, nausea, and diaphoresis, particularly on exertion.  No history of similar symptoms.  EMS given ASA and nitroglycerin with relief in his symptoms from 10/10-3/10.  Denies any tobacco use.  Denies any family cardiac history.  Not on any blood thinning medications.   Past Medical Hx Past Medical History:  Diagnosis Date  . Asthma     Problem List There are no problems to display for this patient.   Past Surgical Hx Past Surgical History:  Procedure Laterality Date  . NO PAST SURGERIES      Medications Prior to Admission medications   Medication Sig Start Date End Date Taking? Authorizing Provider  albuterol (VENTOLIN HFA) 108 (90 Base) MCG/ACT inhaler Inhale 2 puffs into the lungs every 6 (six) hours as needed for wheezing or shortness of breath. 11/15/19   Joni Reining, PA-C  escitalopram (LEXAPRO) 5 MG tablet Take 5 mg by mouth daily.    [provider]  mirtazapine (REMERON) 30 MG tablet Take 30 mg by mouth at bedtime.    [provider]    Allergies Patient has no known allergies.  Family Hx Family History  Problem Relation Age of Onset  . Healthy Mother   . Healthy Father     Social Hx Social History   Tobacco Use  . Smoking status: Never Smoker  . Smokeless tobacco: Never Used   Substance Use Topics  . Alcohol use: Yes    Comment: occasionally  . Drug use: No     Review of Systems  Constitutional: Negative for fever. Negative for chills. Eyes: Negative for visual changes. ENT: Negative for sore throat. Cardiovascular: + for chest pain. Respiratory: Negative for shortness of breath. Gastrointestinal: Negative for nausea. Negative for vomiting.  Genitourinary: Negative for dysuria. Musculoskeletal: Negative for leg swelling. Skin: Negative for rash. Neurological: Negative for headaches.   Physical Exam  Vital Signs: ED Triage Vitals  Enc Vitals Group     BP 04/06/20 2358 (!) 186/97     Pulse Rate 04/06/20 2357 80     Resp 04/06/20 2357 18     Temp 04/06/20 2357 98.6 F (37 C)     Temp Source 04/06/20 2357 Oral     SpO2 04/06/20 2357 96 %     Weight 04/06/20 2358 180 lb (81.6 kg)     Height 04/06/20 2358 5\' 10"  (1.778 m)     Head Circumference --      Peak Flow --      Pain Score 04/06/20 2358 3     Pain Loc --      Pain Edu? --      Excl. in GC? --     Constitutional: Alert and oriented. Well appearing. NAD.  Head: Normocephalic. Atraumatic. Eyes: Conjunctivae clear. Sclera anicteric. Pupils equal and symmetric. Nose: No masses or lesions. No congestion or  rhinorrhea. Mouth/Throat: Wearing mask.  Neck: No stridor. Trachea midline.  Cardiovascular: Normal rate, regular rhythm. Extremities well perfused. Respiratory: Normal respiratory effort.  Lungs CTAB. Gastrointestinal: Soft. Non-distended. Non-tender.  Genitourinary: Deferred. Musculoskeletal: No lower extremity edema. No deformities. Neurologic:  Normal speech and language. No gross focal or lateralizing neurologic deficits are appreciated.  Skin: Skin is warm, dry and intact. No rash noted. Psychiatric: Mood and affect are appropriate for situation.  EKG  Personally reviewed and interpreted by myself.   Date: 04/07/20 Time: 0002 Rate: 70 Rhythm: sinus Axis:  normal Intervals: WNL TWI V2-V5, new compared from prior TWI appear deep and nearly symmetrical    Radiology  Personally reviewed available imaging myself.   CXR - IMPRESSION:  No active cardiopulmonary disease.    Procedures  Procedure(s) performed (including critical care):  .Critical Care Performed by: Lilia Pro., MD Authorized by: Lilia Pro., MD   Critical care provider statement:    Critical care time (minutes):  35   Critical care was necessary to treat or prevent imminent or life-threatening deterioration of the following conditions: NSTEMI.   Critical care was time spent personally by me on the following activities:  Discussions with consultants, evaluation of patient's response to treatment, examination of patient, ordering and performing treatments and interventions, ordering and review of laboratory studies, ordering and review of radiographic studies, pulse oximetry, re-evaluation of patient's condition, obtaining history from patient or surrogate and review of old charts     Initial Impression / Assessment and Plan / MDM / ED Course  49 y.o. male who presents to the ED for chest pain, concerning for ACS, exertional angina.  History as above.  Ddx: ACS, exertional angina, PE  EKG reveals new T wave inversions V2 through V5 compared to prior, query possible Wellen's.  Initial high-sensitivity troponin elevated at 67.  D-dimer is negative.  Given his symptoms with EKG changes and mildly elevated HS troponin, will plan to admit and initiate heparin drip.   Discussed w/ hospitalist for admission.  _______________________________   As part of my medical decision making I have reviewed available labs, radiology tests, reviewed old records/performed chart review.    Final Clinical Impression(s) / ED Diagnosis  Chest pain in adult Abnormal EKG Elevated high-sensitivity troponin     Note:  This document was prepared using Dragon voice recognition  software and may include unintentional dictation errors.   Lilia Pro., MD 04/07/20 [phone]

## 2020-04-07 NOTE — Progress Notes (Signed)
Patient clinically stable post heart cath per Dr Gwen Pounds, tolerated well. TR band intact,no visible bleeding nor hematoma at site right radial. Awake/alert and oriented post procedure. Dr Gwen Pounds out to speak with patient who will be transferred to [facility] available. Denies complaints at this time.report called to care nurse with plan reviewed. Questions answered.

## 2020-04-07 NOTE — Progress Notes (Signed)
PROGRESS NOTE    Oscar Norris Sr.  YQI:[id number] DOB: 1971-04-03 DOA: 04/07/2020 PCP: [facility], [place] [facility]   Brief Narrative:  Patient is 49 year old male with history of asthma dyslipidemia, depression who came to the hospital complaining of acute onset of chest pain.  The pain was a 10/10 in severity, radiated to the back.  He has mild elevation of troponin. Patient states that he has been having palpitation and chest pain with exertion for 4 to 5 months, symptoms started after walking 4-5 blocks.  For the last month, symptom has been getting worse, he can walk only about 1 block.  In the emergency room, EKG showed T wave inversion V1 through V6.  Troponin slightly elevated.  He was given sublingual nitroglycerin, the pain is better.   Assessment & Plan:   Active Problems:   Chest pain  #1.  Unstable angina with possible non-STEMI. Troponin slightly elevated.  Symptoms are typical for angina.  I discussed with Dr. Nehemiah Massed, who has seen the patient already.  Planning for heart cath. Patient is on Lipitor, aspirin, will add metoprolol.  #2.  Hypertension emergency. Blood pressure is better after giving labetalol.  3.  Hyperglycemia. Pending HbA1c.  Continue coverage with NovoLog.  4.  Dyslipidemia. Significant elevation of triglycerides.  Currently covered with Lipitor.  5.  Asthma.  Stable.   DVT prophylaxis: Lovenox Code Status: Full Family Communication: None at bedside Disposition Plan:  . Patient came from: home            . Anticipated d/c place:Home . Barriers to d/c OR conditions which need to be met to effect a safe d/c:   Consultants:   Cardiology  Procedures: None Antimicrobials: None Subjective: Patient chest pain feel better this morning.  No short of breath.  No nausea vomiting. No abdominal pain or nausea vomiting.  No diarrhea constipation.  Objective: Vitals:   04/07/20 0500 04/07/20 0530 04/07/20 0600 04/07/20 0630  BP: (!) 163/101 (!)  159/106 (!) 152/106 (!) 152/99  Pulse: 66 68 66 68  Resp: _0 Temp:      TempSrc:      SpO2: 94% 97% 96% 97%  Weight:      Height:       No intake or output data in the 24 hours ending 04/07/20 0802 Filed Weights   04/06/20 2358  Weight: 81.6 kg    Examination:  General exam: Appears calm and comfortable  Respiratory system: Clear to auscultation. Respiratory effort normal. Cardiovascular system: S1 & S2 heard, RRR. No JVD, murmurs, rubs, gallops or clicks. No pedal edema. Gastrointestinal system: Abdomen is nondistended, soft and nontender. No organomegaly or masses felt. Normal bowel sounds heard. Central nervous system: Alert and oriented. No focal neurological deficits. Extremities: Symmetric 5 x 5 power. Skin: No rashes, lesions or ulcers Psychiatry: Judgement and insight appear normal. Mood & affect appropriate.     Data Reviewed: I have personally reviewed following labs and imaging studies  CBC: Recent Labs  Lab 04/07/20 0001  WBC 5.0  HGB 14.3  HCT 41.2  MCV 85.8  PLT 387   Basic Metabolic Panel: Recent Labs  Lab 04/07/20 0051 04/07/20 0643  NA 135 137  K 3.9 3.7  CL 101 102  CO2 24 27  GLUCOSE 279* 164*  BUN 11 10  CREATININE 0.93 0.93  CALCIUM 8.8* 8.7*  MG  --  2.4   GFR: Estimated Creatinine Clearance: 99.2 mL/min (by C-G formula based on SCr of  0.93 mg/dL). Liver Function Tests: Recent Labs  Lab 04/07/20 0643  AST 23  ALT 24  ALKPHOS 55  BILITOT 0.5  PROT 7.5  ALBUMIN 4.0   Recent Labs  Lab 04/07/20 0643  LIPASE 26   No results for input(s): AMMONIA in the last 168 hours. Coagulation Profile: Recent Labs  Lab 04/07/20 0242  INR 1.1   Cardiac Enzymes: No results for input(s): CKTOTAL, CKMB, CKMBINDEX, TROPONINI in the last 168 hours. BNP (last 3 results) No results for input(s): PROBNP in the last 8760 hours. HbA1C: No results for input(s): HGBA1C in the last 72 hours. CBG: No results for input(s): GLUCAP in  the last 168 hours. Lipid Profile: Recent Labs    04/07/20 0643  CHOL 198  HDL 35*  LDLCALC UNABLE TO CALCULATE IF TRIGLYCERIDE OVER 400 mg/dL  TRIG 468*  CHOLHDL 5.7   Thyroid Function Tests: No results for input(s): TSH, T4TOTAL, FREET4, T3FREE, THYROIDAB in the last 72 hours. Anemia Panel: No results for input(s): VITAMINB12, FOLATE, FERRITIN, TIBC, IRON, RETICCTPCT in the last 72 hours. Sepsis Labs: No results for input(s): PROCALCITON, LATICACIDVEN in the last 168 hours.  No results found for this or any previous visit (from the past 240 hour(s)).       Radiology Studies: DG Chest 2 View  Result Date: 04/07/2020 CLINICAL DATA:  49 year old male with chest pain. EXAM: CHEST - 2 VIEW COMPARISON:  Chest radiograph dated 11/15/2019. FINDINGS: The heart size and mediastinal contours are within normal limits. Both lungs are clear. The visualized skeletal structures are unremarkable. IMPRESSION: No active cardiopulmonary disease. Electronically Signed   By: Anner Crete M.D.   On: 04/07/2020 00:30        Scheduled Meds: . aspirin EC  81 mg Oral Daily  . atorvastatin  40 mg Oral Daily  . escitalopram  5 mg Oral Daily  . insulin aspart  0-15 Units Subcutaneous Q6H  . mirtazapine  30 mg Oral QHS  . sodium chloride flush  3 mL Intravenous Q12H   Continuous Infusions: . sodium chloride 100 mL/hr at 04/07/20 0257  . heparin 1,150 Units/hr (04/07/20 0248)     LOS: 0 days    Time spent: 25 minutes    Sharen Hones, MD [facility]   To contact the attending provider between 7A-7P or the covering provider during after hours 7P-7A, please log into the web site [email or web address] and access using universal [place] password for that web site. If you do not have the password, please call the hospital operator.  04/07/2020, 8:02 AM

## 2020-04-07 NOTE — Progress Notes (Signed)
Page sent to Dr. Gwen Pounds to clarify Heparin gtt order. Post Procedure orders to d/c heparin labs but heparin gtt not addressed. Text page sent and phone page sent as well to Dr. Gwen Pounds. 2A updated and will follow up.

## 2020-04-07 NOTE — Progress Notes (Signed)
ANTICOAGULATION CONSULT NOTE - Initial Consult  Pharmacy Consult for heparin Indication: chest pain/ACS  No Known Allergies  Patient Measurements: Height: 5\' 10"  (177.8 cm) Weight: 81.6 kg (180 lb) IBW/kg (Calculated) : 73 Heparin Dosing Weight: 81.6 kg  Vital Signs: Temp: 98.6 F (37 C) (05/09 2357) Temp Source: Oral (05/09 2357) BP: 175/121 (05/10 0800) Pulse Rate: 70 (05/10 0800)  Labs: Recent Labs    04/07/20 0001 04/07/20 0051 04/07/20 0051 04/07/20 0242 04/07/20 0643 04/07/20 0823  HGB 14.3  --   --   --   --   --   HCT 41.2  --   --   --   --   --   PLT 214  --   --   --   --   --   APTT  --   --   --  34  --   --   LABPROT  --   --   --  13.4  --   --   INR  --   --   --  1.1  --   --   HEPARINUNFRC  --   --   --   --   --  0.23*  CREATININE  --  0.93  --   --  0.93  --   TROPONINIHS  --  67*   < > 82* 73* 62*   < > = values in this interval not displayed.    Estimated Creatinine Clearance: 99.2 mL/min (by C-G formula based on SCr of 0.93 mg/dL).   Medical History: Past Medical History:  Diagnosis Date  . Asthma     Medications:  Scheduled:  . aspirin EC  81 mg Oral Daily  . atorvastatin  40 mg Oral Daily  . escitalopram  5 mg Oral Daily  . insulin aspart  0-15 Units Subcutaneous Q6H  . metoprolol succinate  25 mg Oral Daily  . mirtazapine  30 mg Oral QHS  . sodium chloride flush  3 mL Intravenous Q12H    Assessment: Patient admitted x CP that radiates to mid-chest area w/ h/o asthma, depression, HTN, HLD presenting to the ED w/ trops up to 67 (repeats pending), EKG showing non-specific T wave abnormalities, no anticoagulation on file, baseline CBC WNL, STAT aPTT/INR pending. Patient is being started on heparin drip for management of NSTEMI.  5/10 AM Heparin infusion started @ 1150 units/hr  5/10 @ 0823 0.23- Level subtherapeutic.   Goal of Therapy:  Heparin level 0.3-0.7 units/ml Monitor platelets by anticoagulation protocol: Yes   Plan:   5/10 @ 0823 0.23- Level subtherapeutic.  Will order 1200 unit bolus and increase infusion rate to 1300 units/hr.  Recheck anti-Xa level in 6 hours Will monitor daily CBC's and adjust per anti-Xa levels.  06/07/20, PharmD, BCPS Clinical Pharmacist 04/07/2020 9:27 AM

## 2020-04-08 ENCOUNTER — Encounter: Payer: Self-pay | Admitting: Cardiology

## 2020-04-08 ENCOUNTER — Inpatient Hospital Stay
Admission: AD | Admit: 2020-04-08 | Payer: Medicaid Other | Source: Other Acute Inpatient Hospital | Admitting: Cardiovascular Disease

## 2020-04-08 ENCOUNTER — Inpatient Hospital Stay (HOSPITAL_COMMUNITY)
Admission: RE | Admit: 2020-04-08 | Discharge: 2020-04-09 | DRG: 247 | Disposition: A | Discharge status: Home / Self Care | Payer: Medicaid Other | Source: Other Acute Inpatient Hospital | Attending: Cardiology | Admitting: Cardiology

## 2020-04-08 ENCOUNTER — Inpatient Hospital Stay (HOSPITAL_COMMUNITY)
Admission: RE | Disposition: A | Discharge status: Home / Self Care | Payer: Self-pay | Source: Other Acute Inpatient Hospital | Attending: Cardiovascular Disease

## 2020-04-08 DIAGNOSIS — Z20822 Contact with and (suspected) exposure to covid-19: Secondary | ICD-10-CM | POA: Diagnosis present

## 2020-04-08 DIAGNOSIS — F329 Major depressive disorder, single episode, unspecified: Secondary | ICD-10-CM | POA: Diagnosis not present

## 2020-04-08 DIAGNOSIS — E669 Obesity, unspecified: Secondary | ICD-10-CM | POA: Diagnosis present

## 2020-04-08 DIAGNOSIS — I214 Non-ST elevation (NSTEMI) myocardial infarction: Secondary | ICD-10-CM | POA: Diagnosis not present

## 2020-04-08 DIAGNOSIS — Z955 Presence of coronary angioplasty implant and graft: Secondary | ICD-10-CM

## 2020-04-08 DIAGNOSIS — Z7951 Long term (current) use of inhaled steroids: Secondary | ICD-10-CM

## 2020-04-08 DIAGNOSIS — Z8249 Family history of ischemic heart disease and other diseases of the circulatory system: Secondary | ICD-10-CM | POA: Diagnosis not present

## 2020-04-08 DIAGNOSIS — Z79899 Other long term (current) drug therapy: Secondary | ICD-10-CM | POA: Diagnosis not present

## 2020-04-08 DIAGNOSIS — Z8616 Personal history of COVID-19: Secondary | ICD-10-CM | POA: Diagnosis not present

## 2020-04-08 DIAGNOSIS — F32A Depression, unspecified: Secondary | ICD-10-CM | POA: Diagnosis present

## 2020-04-08 DIAGNOSIS — J45909 Unspecified asthma, uncomplicated: Secondary | ICD-10-CM | POA: Diagnosis present

## 2020-04-08 DIAGNOSIS — I2510 Atherosclerotic heart disease of native coronary artery without angina pectoris: Secondary | ICD-10-CM

## 2020-04-08 DIAGNOSIS — Z833 Family history of diabetes mellitus: Secondary | ICD-10-CM | POA: Diagnosis not present

## 2020-04-08 DIAGNOSIS — E785 Hyperlipidemia, unspecified: Secondary | ICD-10-CM | POA: Diagnosis not present

## 2020-04-08 DIAGNOSIS — Z6836 Body mass index (BMI) 36.0-36.9, adult: Secondary | ICD-10-CM

## 2020-04-08 DIAGNOSIS — R519 Headache, unspecified: Secondary | ICD-10-CM | POA: Diagnosis not present

## 2020-04-08 DIAGNOSIS — E118 Type 2 diabetes mellitus with unspecified complications: Secondary | ICD-10-CM | POA: Diagnosis not present

## 2020-04-08 DIAGNOSIS — I10 Essential (primary) hypertension: Secondary | ICD-10-CM | POA: Diagnosis not present

## 2020-04-08 DIAGNOSIS — I25110 Atherosclerotic heart disease of native coronary artery with unstable angina pectoris: Secondary | ICD-10-CM | POA: Diagnosis present

## 2020-04-08 DIAGNOSIS — I160 Hypertensive urgency: Secondary | ICD-10-CM | POA: Diagnosis present

## 2020-04-08 DIAGNOSIS — I161 Hypertensive emergency: Secondary | ICD-10-CM | POA: Diagnosis not present

## 2020-04-08 HISTORY — DX: Hyperlipidemia, unspecified: E78.5

## 2020-04-08 HISTORY — PX: INTRAVASCULAR ULTRASOUND/IVUS: CATH118244

## 2020-04-08 HISTORY — DX: Atherosclerotic heart disease of native coronary artery without angina pectoris: I25.10

## 2020-04-08 HISTORY — PX: CORONARY ATHERECTOMY: CATH118238

## 2020-04-08 HISTORY — DX: Essential (primary) hypertension: I10

## 2020-04-08 HISTORY — PX: CORONARY STENT INTERVENTION: CATH118234

## 2020-04-08 HISTORY — DX: Obesity, unspecified: E66.9

## 2020-04-08 LAB — CBC
HCT: 39.6 % (ref 39.0–52.0)
HCT: 43.8 % (ref 39.0–52.0)
Hemoglobin: 13.5 g/dL (ref 13.0–17.0)
Hemoglobin: 14.7 g/dL (ref 13.0–17.0)
MCH: 29.4 pg (ref 26.0–34.0)
MCH: 29.1 pg (ref 26.0–34.0)
MCHC: 34.1 g/dL (ref 30.0–36.0)
MCHC: 33.6 g/dL (ref 30.0–36.0)
MCV: 86.3 fL (ref 80.0–100.0)
MCV: 86.7 fL (ref 80.0–100.0)
Platelets: 204 10*3/uL (ref 150–400)
Platelets: 229 10*3/uL (ref 150–400)
RBC: 4.59 MIL/uL (ref 4.22–5.81)
RBC: 5.05 MIL/uL (ref 4.22–5.81)
RDW: 14.1 % (ref 11.5–15.5)
RDW: 13.8 % (ref 11.5–15.5)
WBC: 5.1 10*3/uL (ref 4.0–10.5)
WBC: 6.8 10*3/uL (ref 4.0–10.5)
nRBC: 0 % (ref 0.0–0.2)
nRBC: 0 % (ref 0.0–0.2)

## 2020-04-08 LAB — BASIC METABOLIC PANEL
Anion gap: 9 (ref 5–15)
BUN: 9 mg/dL (ref 6–20)
CO2: 25 mmol/L (ref 22–32)
Calcium: 8.9 mg/dL (ref 8.9–10.3)
Chloride: 103 mmol/L (ref 98–111)
Creatinine, Ser: 0.93 mg/dL (ref 0.61–1.24)
GFR calc Af Amer: >60 mL/min (ref >60)
GFR calc non Af Amer: >60 mL/min (ref >60)
Glucose, Bld: 134 mg/dL — ABNORMAL HIGH (ref 70–99)
Potassium: 4.1 mmol/L (ref 3.5–5.1)
Sodium: 137 mmol/L (ref 135–145)

## 2020-04-08 LAB — POCT ACTIVATED CLOTTING TIME
Activated Clotting Time: 467 seconds
Activated Clotting Time: 257 seconds
Activated Clotting Time: 296 seconds

## 2020-04-08 LAB — HIV ANTIBODY (ROUTINE TESTING W REFLEX): HIV Screen 4th Generation wRfx: NONREACTIVE

## 2020-04-08 LAB — GLUCOSE, CAPILLARY: Glucose-Capillary: 133 mg/dL — ABNORMAL HIGH (ref 70–99)

## 2020-04-08 SURGERY — CORONARY ATHERECTOMY
Anesthesia: LOCAL

## 2020-04-08 MED ORDER — ASPIRIN 81 MG PO TBEC: 81.0000 mg | DELAYED_RELEASE_TABLET | Freq: Every day | ORAL | 0 refills | Status: AC

## 2020-04-08 MED ORDER — HYDRALAZINE HCL 20 MG/ML IJ SOLN: 10.0000 mg | INTRAMUSCULAR | Status: AC | PRN

## 2020-04-08 MED ORDER — ALBUTEROL SULFATE (2.5 MG/3ML) 0.083% IN NEBU: 3.0000 mL | INHALATION_SOLUTION | Freq: Four times a day (QID) | RESPIRATORY_TRACT | Status: DC | PRN

## 2020-04-08 MED ORDER — MIRTAZAPINE 30 MG PO TABS
30.0000 mg | ORAL_TABLET | Freq: Every day | ORAL | Status: DC
  Administered 2020-04-08: 30 mg via ORAL
  Filled 2020-04-08 (×2): qty 1

## 2020-04-08 MED ORDER — AMLODIPINE BESYLATE 5 MG PO TABS
5.0000 mg | ORAL_TABLET | Freq: Every day | ORAL | Status: DC
  Administered 2020-04-08 – 2020-04-09 (×2): 5 mg via ORAL
  Filled 2020-04-08 (×2): qty 1

## 2020-04-08 MED ORDER — VERAPAMIL HCL 2.5 MG/ML IV SOLN
INTRAVENOUS | Status: AC
  Filled 2020-04-08: qty 2

## 2020-04-08 MED ORDER — LIDOCAINE HCL (PF) 1 % IJ SOLN
INTRAMUSCULAR | Status: AC
  Filled 2020-04-08: qty 30

## 2020-04-08 MED ORDER — HEPARIN SODIUM (PORCINE) 1000 UNIT/ML IJ SOLN
INTRAMUSCULAR | Status: AC
  Filled 2020-04-08: qty 1

## 2020-04-08 MED ORDER — NITROGLYCERIN 1 MG/10 ML FOR IR/CATH LAB
INTRA_ARTERIAL | Status: DC | PRN
  Administered 2020-04-08 (×5): 200 ug via INTRACORONARY

## 2020-04-08 MED ORDER — METOPROLOL SUCCINATE ER 25 MG PO TB24
25.0000 mg | ORAL_TABLET | Freq: Every day | ORAL | Status: DC
  Administered 2020-04-09: 25 mg via ORAL
  Filled 2020-04-08: qty 1

## 2020-04-08 MED ORDER — MOMETASONE FURO-FORMOTEROL FUM 200-5 MCG/ACT IN AERO
2.0000 | INHALATION_SPRAY | Freq: Two times a day (BID) | RESPIRATORY_TRACT | Status: DC
  Administered 2020-04-09: 2 via RESPIRATORY_TRACT
  Filled 2020-04-08 (×2): qty 8.8

## 2020-04-08 MED ORDER — NITROGLYCERIN 1 MG/10 ML FOR IR/CATH LAB
INTRA_ARTERIAL | Status: AC
  Filled 2020-04-08: qty 10

## 2020-04-08 MED ORDER — METOPROLOL SUCCINATE ER 25 MG PO TB24: 25.0000 mg | ORAL_TABLET | Freq: Every day | ORAL | 0 refills | Status: DC

## 2020-04-08 MED ORDER — HYDRALAZINE HCL 20 MG/ML IJ SOLN
INTRAMUSCULAR | Status: AC
  Filled 2020-04-08: qty 1

## 2020-04-08 MED ORDER — MIDAZOLAM HCL 2 MG/2ML IJ SOLN
INTRAMUSCULAR | Status: DC | PRN
  Administered 2020-04-08: 2 mg via INTRAVENOUS
  Administered 2020-04-08 (×2): 1 mg via INTRAVENOUS

## 2020-04-08 MED ORDER — SODIUM CHLORIDE 0.9% FLUSH: 3.0000 mL | INTRAVENOUS | Status: DC | PRN

## 2020-04-08 MED ORDER — LIDOCAINE HCL (PF) 1 % IJ SOLN
INTRAMUSCULAR | Status: DC | PRN
  Administered 2020-04-08: 2 mL

## 2020-04-08 MED ORDER — HYDRALAZINE HCL 20 MG/ML IJ SOLN
INTRAMUSCULAR | Status: DC | PRN
  Administered 2020-04-08: 10 mg via INTRAVENOUS

## 2020-04-08 MED ORDER — VIPERSLIDE LUBRICANT OPTIME
TOPICAL | Status: DC | PRN
  Administered 2020-04-08: 20 mL via SURGICAL_CAVITY

## 2020-04-08 MED ORDER — ATORVASTATIN CALCIUM 80 MG PO TABS
80.0000 mg | ORAL_TABLET | Freq: Every day | ORAL | Status: DC
  Administered 2020-04-08: 80 mg via ORAL
  Filled 2020-04-08: qty 1

## 2020-04-08 MED ORDER — SODIUM CHLORIDE 0.9 % IV SOLN
INTRAVENOUS | Status: AC | PRN
  Administered 2020-04-08: 1000 mL via INTRAVENOUS

## 2020-04-08 MED ORDER — HEPARIN SODIUM (PORCINE) 1000 UNIT/ML IJ SOLN
INTRAMUSCULAR | Status: DC | PRN
  Administered 2020-04-08: 4000 [IU] via INTRAVENOUS
  Administered 2020-04-08: 10000 [IU] via INTRAVENOUS

## 2020-04-08 MED ORDER — IOHEXOL 350 MG/ML SOLN
INTRAVENOUS | Status: AC
  Filled 2020-04-08: qty 1

## 2020-04-08 MED ORDER — ACETAMINOPHEN 325 MG PO TABS
650.0000 mg | ORAL_TABLET | ORAL | Status: DC | PRN
  Administered 2020-04-08: 650 mg via ORAL
  Filled 2020-04-08: qty 2

## 2020-04-08 MED ORDER — MORPHINE SULFATE (PF) 2 MG/ML IV SOLN
2.0000 mg | INTRAVENOUS | Status: DC | PRN
  Administered 2020-04-08 – 2020-04-09 (×3): 2 mg via INTRAVENOUS
  Filled 2020-04-08 (×3): qty 1

## 2020-04-08 MED ORDER — MIDAZOLAM HCL 2 MG/2ML IJ SOLN
INTRAMUSCULAR | Status: AC
  Filled 2020-04-08: qty 2

## 2020-04-08 MED ORDER — VERAPAMIL HCL 2.5 MG/ML IV SOLN
INTRAVENOUS | Status: DC | PRN
  Administered 2020-04-08: 10 mL via INTRA_ARTERIAL

## 2020-04-08 MED ORDER — ONDANSETRON HCL 4 MG/2ML IJ SOLN: 4.0000 mg | Freq: Four times a day (QID) | INTRAMUSCULAR | Status: DC | PRN

## 2020-04-08 MED ORDER — PRASUGREL HCL 10 MG PO TABS
60.0000 mg | ORAL_TABLET | Freq: Once | ORAL | Status: AC
  Administered 2020-04-08: 09:00:00 60 mg via ORAL
  Filled 2020-04-08: qty 6

## 2020-04-08 MED ORDER — HEPARIN (PORCINE) IN NACL 1000-0.9 UT/500ML-% IV SOLN
INTRAVENOUS | Status: AC
  Filled 2020-04-08: qty 500

## 2020-04-08 MED ORDER — ESCITALOPRAM OXALATE 10 MG PO TABS
5.0000 mg | ORAL_TABLET | Freq: Every day | ORAL | Status: DC
  Administered 2020-04-09: 5 mg via ORAL
  Filled 2020-04-08: qty 1

## 2020-04-08 MED ORDER — NITROGLYCERIN 0.4 MG SL SUBL: 0.4000 mg | SUBLINGUAL_TABLET | SUBLINGUAL | Status: DC | PRN

## 2020-04-08 MED ORDER — FENTANYL CITRATE (PF) 100 MCG/2ML IJ SOLN
INTRAMUSCULAR | Status: DC | PRN
  Administered 2020-04-08: 50 ug via INTRAVENOUS
  Administered 2020-04-08 (×2): 25 ug via INTRAVENOUS

## 2020-04-08 MED ORDER — IOHEXOL 350 MG/ML SOLN
INTRAVENOUS | Status: DC | PRN
  Administered 2020-04-08: 110 mL

## 2020-04-08 MED ORDER — HEPARIN (PORCINE) IN NACL 1000-0.9 UT/500ML-% IV SOLN
INTRAVENOUS | Status: DC | PRN
  Administered 2020-04-08 (×2): 500 mL

## 2020-04-08 MED ORDER — ASPIRIN EC 81 MG PO TBEC
81.0000 mg | DELAYED_RELEASE_TABLET | Freq: Every day | ORAL | Status: DC
  Administered 2020-04-09: 81 mg via ORAL
  Filled 2020-04-08: qty 1

## 2020-04-08 MED ORDER — IOHEXOL 300 MG/ML  SOLN
INTRAMUSCULAR | Status: DC | PRN
  Administered 2020-04-07: 110 mL

## 2020-04-08 MED ORDER — PRASUGREL HCL 10 MG PO TABS
10.0000 mg | ORAL_TABLET | Freq: Every day | ORAL | Status: DC
  Administered 2020-04-09: 10 mg via ORAL
  Filled 2020-04-08: qty 1

## 2020-04-08 MED ORDER — INSULIN ASPART 100 UNIT/ML ~~LOC~~ SOLN: 0.0000 [IU] | Freq: Three times a day (TID) | SUBCUTANEOUS | Status: DC

## 2020-04-08 MED ORDER — SODIUM CHLORIDE 0.9 % IV SOLN: 250.0000 mL | INTRAVENOUS | Status: DC | PRN

## 2020-04-08 MED ORDER — FENTANYL CITRATE (PF) 100 MCG/2ML IJ SOLN
INTRAMUSCULAR | Status: AC
  Filled 2020-04-08: qty 2

## 2020-04-08 MED ORDER — TRAMADOL HCL 50 MG PO TABS
50.0000 mg | ORAL_TABLET | Freq: Four times a day (QID) | ORAL | Status: DC | PRN
  Administered 2020-04-08 – 2020-04-09 (×2): 100 mg via ORAL
  Filled 2020-04-08 (×2): qty 2

## 2020-04-08 MED ORDER — SODIUM CHLORIDE 0.9 % WEIGHT BASED INFUSION
1.0000 mL/kg/h | INTRAVENOUS | Status: AC
  Administered 2020-04-08: 1 mL/kg/h via INTRAVENOUS

## 2020-04-08 MED ORDER — LABETALOL HCL 5 MG/ML IV SOLN: 10.0000 mg | INTRAVENOUS | Status: AC | PRN

## 2020-04-08 MED ORDER — ASPIRIN 81 MG PO TBEC: 81.0000 mg | DELAYED_RELEASE_TABLET | Freq: Every day | ORAL | Status: DC

## 2020-04-08 MED ORDER — SODIUM CHLORIDE 0.9% FLUSH
3.0000 mL | Freq: Two times a day (BID) | INTRAVENOUS | Status: DC
  Administered 2020-04-09: 3 mL via INTRAVENOUS

## 2020-04-08 SURGICAL SUPPLY — 32 items
BALLN  ~~LOC~~ SAPPHIRE 4.5X18 (BALLOONS) ×1
BALLN SAPPHIRE 2.25X10 (BALLOONS) ×2
BALLN SAPPHIRE 3.5X10 (BALLOONS) ×2
BALLN WOLVERINE 2.25X10 (BALLOONS) ×2
BALLN ~~LOC~~ EUPHORA RX 4.5X15 (BALLOONS) ×2
BALLN ~~LOC~~ SAPPHIRE 4.5X18 (BALLOONS) ×1
BALLOON SAPPHIRE 2.25X10 (BALLOONS) ×1 IMPLANT
BALLOON SAPPHIRE 3.5X10 (BALLOONS) ×1 IMPLANT
BALLOON WOLVERINE 2.25X10 (BALLOONS) ×1 IMPLANT
BALLOON ~~LOC~~ EUPHORA RX 4.5X15 (BALLOONS) ×1 IMPLANT
BALLOON ~~LOC~~ SAPPHIRE 4.5X18 (BALLOONS) ×1 IMPLANT
CATH OPTICROSS HD (CATHETERS) ×2 IMPLANT
CATH TELEPORT (CATHETERS) ×2 IMPLANT
CATH VISTA GUIDE 6FR XBLAD3.5 (CATHETERS) ×2 IMPLANT
CROWN DIAMONDBACK CLASSIC 1.25 (BURR) ×2 IMPLANT
DEVICE RAD COMP TR BAND LRG (VASCULAR PRODUCTS) ×2 IMPLANT
ELECT DEFIB PAD ADLT CADENCE (PAD) ×2 IMPLANT
GLIDESHEATH SLEND SS 6F .021 (SHEATH) ×2 IMPLANT
GUIDEWIRE INQWIRE 1.5J.035X260 (WIRE) ×1 IMPLANT
INQWIRE 1.5J .035X260CM (WIRE) ×2
KIT ENCORE 26 ADVANTAGE (KITS) ×2 IMPLANT
KIT HEART LEFT (KITS) ×2 IMPLANT
LUBRICANT VIPERSLIDE CORONARY (MISCELLANEOUS) ×2 IMPLANT
PACK CARDIAC CATHETERIZATION (CUSTOM PROCEDURE TRAY) ×2 IMPLANT
SLED PULL BACK IVUS (MISCELLANEOUS) ×2 IMPLANT
STENT RESOLUTE ONYX 4.0X38 (Permanent Stent) ×2 IMPLANT
TRANSDUCER W/STOPCOCK (MISCELLANEOUS) ×2 IMPLANT
TUBING CIL FLEX 10 FLL-RA (TUBING) ×2 IMPLANT
WIRE ASAHI PROWATER 180CM (WIRE) ×2 IMPLANT
WIRE ASAHI PROWATER 300CM (WIRE) ×2 IMPLANT
WIRE HI TORQ BMW 190CM (WIRE) ×2 IMPLANT
WIRE VIPERWIRE COR FLEX .012 (WIRE) ×2 IMPLANT

## 2020-04-08 NOTE — Discharge Summary (Signed)
Physician Discharge Summary  Patient ID: Oscar Ped Garfield Sr. MRN: [id number] DOB/AGE: 04-08-1971 49 y.o.  Admit date: 04/07/2020 Discharge date: 04/08/2020  Admission Diagnoses: Chest pain Discharge Diagnoses:  Active Problems:   Chest pain Non STEMI Hypertension emergency Hyperglycemia secondary to type 2 diabetes with hemoglobin A1c 7.0 Dyslipidemia Asthma  Discharged Condition: good  Hospital Course:  Patient is 49 year old male with history of asthma dyslipidemia, depression who came to the hospital complaining of acute onset of chest pain.  The pain was a 10/10 in severity, radiated to the back.  He has mild elevation of troponin. Patient states that he has been having palpitation and chest pain with exertion for 4 to 5 months, symptoms started after walking 4-5 blocks.  For the last month, symptom has been getting worse, he can walk only about 1 block.  In the emergency room, EKG showed T wave inversion V1 through V6.  Troponin slightly elevated with a peak of 82  He was given sublingual nitroglycerin, the pain is better.  Patient had a coronary angiogram yesterday, reviewed a proximal LAD to mid LAD lesion of 80%, first diagonal lesion of 90%.  Also has nonobstructive lesions in the RCA and the mid LAD.  At this point, patient will be transferred to [facility] for intervention.  He is medically stable to be transferred.  #1.  Non-STEMI. Troponin slightly elevated.  Symptoms are typical for angina.  Cardiac cath showed multivessel disease.  He will be transferred for intervention.  #2.  Hypertension emergency. Blood pressure is better.  3.  Hyperglycemia. HbA1c 7.0.  May. start oral antibiotic medicines after discharge.  4.  Dyslipidemia. Significant elevation of triglycerides at  468.  Currently covered with Lipitor. May treat diabetes first before starting medicine for triglycerides.  5.  Asthma. Stable.  Consults: Cardiology  Significant Diagnostic  Studies:   Coronary angiogram  Prox RCA lesion is 45% stenosed.  Mid RCA lesion is 50% stenosed.  1st Mrg lesion is 35% stenosed.  Prox LAD to Mid LAD lesion is 80% stenosed.  1st Diag lesion is 90% stenosed.  Mid LAD lesion is 50% stenosed.  Treatments: surgery: Cardiac cath  Discharge Exam: Blood pressure (!) 147/93, pulse 64, temperature 98.7 F (37.1 C), temperature source Oral, resp. rate 18, height 5\' 10"  (1.778 m), weight 105.7 kg, SpO2 98 %. General appearance: alert and cooperative Resp: clear to auscultation bilaterally Cardio: regular rate and rhythm, S1, S2 normal, no murmur, click, rub or gallop GI: soft, non-tender; bowel sounds normal; no masses,  no organomegaly Extremities: extremities normal, atraumatic, no cyanosis or edema  Disposition: Discharge disposition: 02-Transferred to [facility]       Discharge Instructions    AMB Referral to Cardiac Rehabilitation - Phase II   Complete by: As directed    Diagnosis: NSTEMI   Diet - low sodium heart healthy   Complete by: As directed    Increase activity slowly   Complete by: As directed      Allergies as of 04/08/2020   No Known Allergies     Medication List    TAKE these medications   Advair Diskus 250-50 MCG/DOSE Aepb Generic drug: Fluticasone-Salmeterol Inhale 1 puff into the lungs 2 (two) times daily.   albuterol 108 (90 Base) MCG/ACT inhaler Commonly known as: VENTOLIN HFA Inhale 2 puffs into the lungs every 6 (six) hours as needed for wheezing or shortness of breath.   aspirin 81 MG EC tablet Take 1 tablet (81 mg  total) by mouth daily. Start taking on: Apr 09, 2020   atorvastatin 20 MG tablet Commonly known as: LIPITOR Take 20 mg by mouth daily.   escitalopram 5 MG tablet Commonly known as: LEXAPRO Take 5 mg by mouth daily.   metoprolol succinate 25 MG 24 hr tablet Commonly known as: TOPROL-XL Take 1 tablet (25 mg total) by mouth daily. Start taking on: Apr 09, 2020    mirtazapine 30 MG tablet Commonly known as: REMERON Take 30 mg by mouth at bedtime.        Signed: Marrion Coy 04/08/2020, 9:54 AM

## 2020-04-08 NOTE — Interval H&P Note (Signed)
History and Physical Interval Note:  04/08/2020 12:48 PM  Oscar Held Shankman Sr.  has presented today for surgery, with the diagnosis of CAD.  The various methods of treatment have been discussed with the patient and family. After consideration of risks, benefits and other options for treatment, the patient has consented to  Procedure(s): CORONARY ATHERECTOMY (N/A) as a surgical intervention.  The patient's history has been reviewed, patient examined, no change in status, stable for surgery.  I have reviewed the patient's chart and labs.  Questions were answered to the patient's satisfaction.   Cath Lab Visit (complete for each Cath Lab visit)  Clinical Evaluation Leading to the Procedure:   ACS: Yes.    Non-ACS:    Anginal Classification: CCS IV  Anti-ischemic medical therapy: Minimal Therapy (1 class of medications)  Non-Invasive Test Results: No non-invasive testing performed  Prior CABG: No previous CABG        Theron Arista [facility] 04/08/2020 12:48 PM

## 2020-04-08 NOTE — Progress Notes (Signed)
Transfer report called to [facility] Cath lab and [facility]/ pts condition stable at discharge

## 2020-04-09 ENCOUNTER — Observation Stay (HOSPITAL_COMMUNITY): Payer: Medicaid Other

## 2020-04-09 ENCOUNTER — Emergency Department
Admission: EM | Admit: 2020-04-09 | Discharge: 2020-04-10 | Disposition: A | Discharge status: Home / Self Care | Payer: Medicaid Other | Attending: Emergency Medicine | Admitting: Emergency Medicine

## 2020-04-09 ENCOUNTER — Other Ambulatory Visit: Payer: Self-pay

## 2020-04-09 ENCOUNTER — Emergency Department: Payer: Medicaid Other

## 2020-04-09 DIAGNOSIS — E785 Hyperlipidemia, unspecified: Secondary | ICD-10-CM | POA: Diagnosis present

## 2020-04-09 DIAGNOSIS — R519 Headache, unspecified: Secondary | ICD-10-CM | POA: Insufficient documentation

## 2020-04-09 DIAGNOSIS — Z20822 Contact with and (suspected) exposure to covid-19: Secondary | ICD-10-CM | POA: Diagnosis present

## 2020-04-09 DIAGNOSIS — I10 Essential (primary) hypertension: Secondary | ICD-10-CM | POA: Insufficient documentation

## 2020-04-09 DIAGNOSIS — I25110 Atherosclerotic heart disease of native coronary artery with unstable angina pectoris: Secondary | ICD-10-CM | POA: Diagnosis present

## 2020-04-09 DIAGNOSIS — Z833 Family history of diabetes mellitus: Secondary | ICD-10-CM | POA: Diagnosis not present

## 2020-04-09 DIAGNOSIS — I160 Hypertensive urgency: Secondary | ICD-10-CM | POA: Diagnosis present

## 2020-04-09 DIAGNOSIS — Z79899 Other long term (current) drug therapy: Secondary | ICD-10-CM | POA: Diagnosis not present

## 2020-04-09 DIAGNOSIS — Z8249 Family history of ischemic heart disease and other diseases of the circulatory system: Secondary | ICD-10-CM | POA: Diagnosis not present

## 2020-04-09 DIAGNOSIS — E119 Type 2 diabetes mellitus without complications: Secondary | ICD-10-CM | POA: Insufficient documentation

## 2020-04-09 DIAGNOSIS — J45909 Unspecified asthma, uncomplicated: Secondary | ICD-10-CM | POA: Insufficient documentation

## 2020-04-09 DIAGNOSIS — Z7982 Long term (current) use of aspirin: Secondary | ICD-10-CM | POA: Diagnosis not present

## 2020-04-09 DIAGNOSIS — E669 Obesity, unspecified: Secondary | ICD-10-CM | POA: Diagnosis present

## 2020-04-09 DIAGNOSIS — Z7951 Long term (current) use of inhaled steroids: Secondary | ICD-10-CM | POA: Diagnosis not present

## 2020-04-09 DIAGNOSIS — Z6836 Body mass index (BMI) 36.0-36.9, adult: Secondary | ICD-10-CM | POA: Diagnosis not present

## 2020-04-09 DIAGNOSIS — E118 Type 2 diabetes mellitus with unspecified complications: Secondary | ICD-10-CM | POA: Diagnosis present

## 2020-04-09 DIAGNOSIS — F329 Major depressive disorder, single episode, unspecified: Secondary | ICD-10-CM | POA: Diagnosis present

## 2020-04-09 DIAGNOSIS — Z955 Presence of coronary angioplasty implant and graft: Secondary | ICD-10-CM | POA: Diagnosis not present

## 2020-04-09 DIAGNOSIS — I2510 Atherosclerotic heart disease of native coronary artery without angina pectoris: Secondary | ICD-10-CM | POA: Diagnosis not present

## 2020-04-09 DIAGNOSIS — Z8616 Personal history of COVID-19: Secondary | ICD-10-CM | POA: Diagnosis not present

## 2020-04-09 DIAGNOSIS — I361 Nonrheumatic tricuspid (valve) insufficiency: Secondary | ICD-10-CM

## 2020-04-09 DIAGNOSIS — I214 Non-ST elevation (NSTEMI) myocardial infarction: Secondary | ICD-10-CM | POA: Diagnosis present

## 2020-04-09 LAB — CBC
HCT: 42.5 % (ref 39.0–52.0)
HCT: 43.9 % (ref 39.0–52.0)
Hemoglobin: 14.3 g/dL (ref 13.0–17.0)
Hemoglobin: 14.7 g/dL (ref 13.0–17.0)
MCH: 29.4 pg (ref 26.0–34.0)
MCH: 29.1 pg (ref 26.0–34.0)
MCHC: 33.6 g/dL (ref 30.0–36.0)
MCHC: 33.5 g/dL (ref 30.0–36.0)
MCV: 87.3 fL (ref 80.0–100.0)
MCV: 86.9 fL (ref 80.0–100.0)
Platelets: 271 10*3/uL (ref 150–400)
Platelets: 260 10*3/uL (ref 150–400)
RBC: 4.87 MIL/uL (ref 4.22–5.81)
RBC: 5.05 MIL/uL (ref 4.22–5.81)
RDW: 13.6 % (ref 11.5–15.5)
RDW: 13.8 % (ref 11.5–15.5)
WBC: 8.5 10*3/uL (ref 4.0–10.5)
WBC: 8 10*3/uL (ref 4.0–10.5)
nRBC: 0 % (ref 0.0–0.2)
nRBC: 0 % (ref 0.0–0.2)

## 2020-04-09 LAB — ECHOCARDIOGRAM COMPLETE
Height: 67 in
Weight: 3681.6 oz

## 2020-04-09 LAB — COMPREHENSIVE METABOLIC PANEL
ALT: 26 U/L (ref 0–44)
AST: 22 U/L (ref 15–41)
Albumin: 4.4 g/dL (ref 3.5–5.0)
Alkaline Phosphatase: 56 U/L (ref 38–126)
Anion gap: 12 (ref 5–15)
BUN: 16 mg/dL (ref 6–20)
CO2: 25 mmol/L (ref 22–32)
Calcium: 9.4 mg/dL (ref 8.9–10.3)
Chloride: 97 mmol/L — ABNORMAL LOW (ref 98–111)
Creatinine, Ser: 1.18 mg/dL (ref 0.61–1.24)
GFR calc Af Amer: >60 mL/min (ref >60)
GFR calc non Af Amer: >60 mL/min (ref >60)
Glucose, Bld: 172 mg/dL — ABNORMAL HIGH (ref 70–99)
Potassium: 3.8 mmol/L (ref 3.5–5.1)
Sodium: 134 mmol/L — ABNORMAL LOW (ref 135–145)
Total Bilirubin: 0.9 mg/dL (ref 0.3–1.2)
Total Protein: 8.3 g/dL — ABNORMAL HIGH (ref 6.5–8.1)

## 2020-04-09 LAB — BASIC METABOLIC PANEL
Anion gap: 11 (ref 5–15)
BUN: 9 mg/dL (ref 6–20)
CO2: 22 mmol/L (ref 22–32)
Calcium: 9.3 mg/dL (ref 8.9–10.3)
Chloride: 102 mmol/L (ref 98–111)
Creatinine, Ser: 1.06 mg/dL (ref 0.61–1.24)
GFR calc Af Amer: >60 mL/min (ref >60)
GFR calc non Af Amer: >60 mL/min (ref >60)
Glucose, Bld: 130 mg/dL — ABNORMAL HIGH (ref 70–99)
Potassium: 3.6 mmol/L (ref 3.5–5.1)
Sodium: 135 mmol/L (ref 135–145)

## 2020-04-09 LAB — LIPASE, BLOOD: Lipase: 20 U/L (ref 11–51)

## 2020-04-09 MED ORDER — ONDANSETRON HCL 4 MG/2ML IJ SOLN
INTRAMUSCULAR | Status: AC
  Administered 2020-04-09: 2 mg via INTRAVENOUS
  Filled 2020-04-09: qty 2

## 2020-04-09 MED ORDER — IOHEXOL 350 MG/ML SOLN
75.0000 mL | Freq: Once | INTRAVENOUS | Status: AC | PRN
  Administered 2020-04-09: 75 mL via INTRAVENOUS

## 2020-04-09 MED ORDER — METOPROLOL SUCCINATE ER 25 MG PO TB24: 25.0000 mg | ORAL_TABLET | Freq: Every day | ORAL | 3 refills | Status: AC

## 2020-04-09 MED ORDER — ACETAMINOPHEN 325 MG PO TABS
650.0000 mg | ORAL_TABLET | Freq: Once | ORAL | Status: AC
  Administered 2020-04-09: 650 mg via ORAL
  Filled 2020-04-09: qty 2

## 2020-04-09 MED ORDER — MORPHINE SULFATE (PF) 4 MG/ML IV SOLN: 4.0000 mg | Freq: Once | INTRAVENOUS | Status: AC

## 2020-04-09 MED ORDER — LISINOPRIL 10 MG PO TABS: 10.0000 mg | ORAL_TABLET | Freq: Every day | ORAL | 3 refills | Status: AC

## 2020-04-09 MED ORDER — LISINOPRIL 10 MG PO TABS
10.0000 mg | ORAL_TABLET | Freq: Every day | ORAL | Status: DC
  Administered 2020-04-09: 10 mg via ORAL
  Filled 2020-04-09: qty 1

## 2020-04-09 MED ORDER — ATORVASTATIN CALCIUM 80 MG PO TABS: 80.0000 mg | ORAL_TABLET | Freq: Every day | ORAL | 3 refills | Status: AC

## 2020-04-09 MED ORDER — ONDANSETRON HCL 4 MG/2ML IJ SOLN: 4.0000 mg | Freq: Once | INTRAMUSCULAR | Status: AC

## 2020-04-09 MED ORDER — MORPHINE SULFATE (PF) 4 MG/ML IV SOLN
INTRAVENOUS | Status: AC
  Administered 2020-04-09: 4 mg via INTRAVENOUS
  Filled 2020-04-09: qty 1

## 2020-04-09 MED ORDER — PRASUGREL HCL 10 MG PO TABS: 10.0000 mg | ORAL_TABLET | Freq: Every day | ORAL | 3 refills | Status: AC

## 2020-04-09 MED ORDER — LIVING WELL WITH DIABETES BOOK
Freq: Once | Status: DC
  Filled 2020-04-09: qty 1

## 2020-04-09 MED ORDER — NITROGLYCERIN 0.4 MG SL SUBL: 0.4000 mg | SUBLINGUAL_TABLET | SUBLINGUAL | 3 refills | Status: AC | PRN

## 2020-04-09 MED FILL — Nitroglycerin IV Soln 100 MCG/ML in D5W: INTRA_ARTERIAL | Qty: 10 | Status: AC

## 2020-04-09 NOTE — Progress Notes (Signed)
Inpatient Diabetes Program Recommendations  AACE/ADA: New Consensus Statement on Inpatient Glycemic Control (2015)  Target Ranges:  Prepandial:   less than 140 mg/dL      Peak postprandial:   less than 180 mg/dL (1-2 hours)      Critically ill patients:  140 - 180 mg/dL   Lab Results  Component Value Date   GLUCAP 133 (H) 04/08/2020   HGBA1C 7.0 (H) 04/07/2020    Review of Glycemic Control  Diabetes history: New-onset Outpatient Diabetes medications: None Current orders for Inpatient glycemic control: Novolog 0-15 units tidwc  HgbA1C - 7% Ordered Living Well with Diabetes book and OP Diabetes Education consult   Inpatient Diabetes Program Recommendations:     Discharge meds: None  Spoke with pt about new diagnosis of DM with HgbA1C of 7%. Pt states MD would like for HgbA1C to be 6.0%-6.5% for tight glycemic control with cardiac risk factors. Discussed importance of weight loss and daily exercise and explained how it would help in controlling blood sugars. Discussed eating healthy, portion control, avoiding foods high in simple sugars such as sodas and sweet tea. Pt states he was drinking regular sodas prior to admission. Encouraged pt to obtain glucose meter and check blood sugars several times/week and take logbook to MD for review. Answered questions.   Pt appreciative of visit.   Thank you. Ailene Ards, RD, LDN, CDE Inpatient Diabetes Coordinator [phone]

## 2020-04-09 NOTE — Progress Notes (Signed)
Progress Note  Patient Name: Oscar OIEN Sr. Date of Encounter: 04/09/2020  Primary Cardiologist: Lamar Blinks, MD   Subjective   Denies any CP or SOB. Had a headache this morning.   Inpatient Medications    Scheduled Meds: . amLODipine  5 mg Oral Daily  . aspirin EC  81 mg Oral Daily  . atorvastatin  80 mg Oral q1800  . escitalopram  5 mg Oral Daily  . insulin aspart  0-15 Units Subcutaneous TID WC  . metoprolol succinate  25 mg Oral Daily  . mirtazapine  30 mg Oral QHS  . mometasone-formoterol  2 puff Inhalation BID  . prasugrel  10 mg Oral Daily  . sodium chloride flush  3 mL Intravenous Q12H   Continuous Infusions: . sodium chloride     PRN Meds: sodium chloride, acetaminophen, albuterol, morphine injection, nitroGLYCERIN, ondansetron (ZOFRAN) IV, sodium chloride flush, traMADol   Vital Signs    Vitals:   04/08/20 2109 04/09/20 0019 04/09/20 0344 04/09/20 0751  BP:  139/79 (!) 156/89 (!) 168/83  Pulse:    80  Resp:    13  Temp: 98.5 F (36.9 C) 98.1 F (36.7 C) 98.1 F (36.7 C) 98.4 F (36.9 C)  TempSrc: Oral Oral Oral Oral  SpO2:    98%  Weight:   104.4 kg   Height:        Intake/Output Summary (Last 24 hours) at 04/09/2020 0859 Last data filed at 04/09/2020 0700 Gross per 24 hour  Intake 111 ml  Output 1950 ml  Net -1839 ml   Last 3 Weights 04/09/2020 04/08/2020 04/08/2020  Weight (lbs) 230 lb 1.6 oz 237 lb 9.6 oz 233 lb  Weight (kg) 104.373 kg 107.775 kg 105.688 kg      Telemetry    NSR without significant ventricular ectopy - Personally Reviewed  ECG    NSR with TWI in anterior leads - Personally Reviewed  Physical Exam   GEN: No acute distress.   Neck: No JVD Cardiac: RRR, no murmurs, rubs, or gallops.  Respiratory: Clear to auscultation bilaterally. GI: Soft, nontender, non-distended  MS: No edema; No deformity. Neuro:  Nonfocal  Psych: Normal affect   Labs    High Sensitivity Troponin:   Recent Labs  Lab 04/07/20 0051  04/07/20 0242 04/07/20 0643 04/07/20 0823  TROPONINIHS 67* 82* 73* 62*      Chemistry Recent Labs  Lab 04/07/20 0643 04/08/20 0430 04/09/20 0427  NA 137 137 135  K 3.7 4.1 3.6  CL 102 103 102  CO2 27 25 22   GLUCOSE 164* 134* 130*  BUN 10 9 9   CREATININE 0.93 0.93 1.06  CALCIUM 8.7* 8.9 9.3  PROT 7.5  --   --   ALBUMIN 4.0  --   --   AST 23  --   --   ALT 24  --   --   ALKPHOS 55  --   --   BILITOT 0.5  --   --   GFRNONAA >60 >60 >60  GFRAA >60 >60 >60  ANIONGAP 8 9 11      Hematology Recent Labs  Lab 04/08/20 0427 04/08/20 2309 04/09/20 0427  WBC 5.1 6.8 8.5  RBC 4.59 5.05 4.87  HGB 13.5 14.7 14.3  HCT 39.6 43.8 42.5  MCV 86.3 86.7 87.3  MCH 29.4 29.1 29.4  MCHC 34.1 33.6 33.6  RDW 14.1 13.8 13.6  PLT 204 229 271    BNP Recent Labs  Lab 04/07/20  1660  BNP 10.0     DDimer No results for input(s): DDIMER in the last 168 hours.   Radiology    CARDIAC CATHETERIZATION  Result Date: 04/08/2020  Prox LAD to Mid LAD lesion is 80% stenosed.  A drug-eluting stent was successfully placed using a STENT RESOLUTE ONYX 4.0X38.  Post intervention, there is a 0% residual stenosis.  1st Diag lesion is 90% stenosed.  Balloon angioplasty was performed using a BALLOON SAPPHIRE 2.25X10.  Post intervention, there is a 65% residual stenosis.  1. Successful atherectomy and stenting of the proximal to mid LAD with IVUS guidance. 2. POBA of the first diagonal. There is some residual stenosis but there remains TIMI 3 flow. Plan: DAPT for one year. Aggressive risk factor modification. Anticipate DC in am.   CARDIAC CATHETERIZATION  Result Date: 04/07/2020  Prox RCA lesion is 45% stenosed.  Mid RCA lesion is 50% stenosed.  1st Mrg lesion is 35% stenosed.  Prox LAD to Mid LAD lesion is 80% stenosed.  1st Diag lesion is 90% stenosed.  Mid LAD lesion is 50% stenosed.  49 year old male with borderline hypertension hyperlipidemia and family history of cardiovascular disease  with progressive anginal symptoms and now chest pain at rest or with elevated troponin consistent with non-ST elevation myocardial infarction LV function with normal ejection fraction 60% Moderate atherosclerosis of 2 vessels and significant atherosclerosis of proximal left anterior descending artery 80% consistent with culprit lesion for non-ST elevation myocardial infarction Plan PCI and stent placement with atherectomy of left anterior descending artery Dual antiplatelet therapy High intensity cholesterol therapy Beta-blocker ACE inhibitor for hypertension control and non-ST elevation myocardial infarction Cardiac rehabilitation    Cardiac Studies   Cath 04/08/2020  Prox RCA lesion is 45% stenosed.  Mid RCA lesion is 50% stenosed.  1st Mrg lesion is 35% stenosed.  Prox LAD to Mid LAD lesion is 80% stenosed.  1st Diag lesion is 90% stenosed.  Mid LAD lesion is 50% stenosed.   49 year old male with borderline hypertension hyperlipidemia and family history of cardiovascular disease with progressive anginal symptoms and now chest pain at rest or with elevated troponin consistent with non-ST elevation myocardial infarction  LV function with normal ejection fraction 60%  Moderate atherosclerosis of 2 vessels and significant atherosclerosis of proximal left anterior descending artery 80% consistent with culprit lesion for non-ST elevation myocardial infarction  Plan PCI and stent placement with atherectomy of left anterior descending artery Dual antiplatelet therapy High intensity cholesterol therapy Beta-blocker ACE inhibitor for hypertension control and non-ST elevation myocardial infarction Cardiac rehabilitation   Cath 04/08/2020  Prox LAD to Mid LAD lesion is 80% stenosed.  A drug-eluting stent was successfully placed using a STENT RESOLUTE ONYX 4.0X38.  Post intervention, there is a 0% residual stenosis.  1st Diag lesion is 90% stenosed.  Balloon angioplasty was  performed using a BALLOON SAPPHIRE 2.25X10.  Post intervention, there is a 65% residual stenosis.   1. Successful atherectomy and stenting of the proximal to mid LAD with IVUS guidance. 2. POBA of the first diagonal. There is some residual stenosis but there remains TIMI 3 flow.   Plan: DAPT for one year. Aggressive risk factor modification. Anticipate DC in am.   Patient Profile     49 y.o. male with PMH of HTN, HLD, and obesity presented with chest pain and ruled in for NSTEMI. He was diagnosed with new DM II during this admission. Triglyceride >400. Diagnostic cath at [facility] revealed LAD disease, he was sent to [facility]  for PCI.   Assessment & Plan    1. NSTEMI  - diagnostic cath at [facility] showed high grade LAD and diagonal lesion. Transferred to [facility] for PCI  - cath 04/08/2020 showed 80% prox to mid LAD lesion treated with Resolute Onyx 4.0x38, 90% D1 treated with balloon angioplasty reduced to 65%.  - aspirin and Effient. Discharge today to followup with Gwen Pounds.   - likely discharge today  2. Newly diagnosed DM II: start on Metformin 48 hours after cath on 5/14.   3. HTN: d/c amlodipine, continue metoprolol. Start on lisinopril 10mg  daily given new DM II.   4. HLD: 80mg  lipitor. Direct LDL 84, triglyceride 468. Suspect lipitor unlikely to reduce triglyceride to goal, will consider vascepa.   5. Obesity  6. Depression  7. Headache: PRN tylenol. No NSAID given the DAPT  For questions or updates, please contact [facility] Please consult [email or web address] for contact info under        Signed, , PA  04/09/2020, 8:59 AM

## 2020-04-09 NOTE — Discharge Instructions (Signed)
No lifting over 5 lbs for 1 week. No sexual activity for 1 week. Keep procedure site clean & dry. If you notice increased pain, swelling, bleeding or pus, call/return!  You may shower, but no soaking baths/hot tubs/pools for 1 week °

## 2020-04-09 NOTE — Discharge Summary (Signed)
Discharge Summary    Patient ID: Oscar Coiner Claire Sr. MRN: [id number]; DOB: 1971/01/02  Admit date: 04/08/2020 Discharge date: 04/09/2020  Primary Care Provider: [facility] [place] Dana [facility] Primary Cardiologist: Lamar Blinks, MD  Primary Electrophysiologist:  None   Discharge Diagnoses    Principal Problem:   Non-ST elevation (NSTEMI) myocardial infarction [facility]) Active Problems:   CAD (coronary artery disease)   Type 2 diabetes mellitus with complication, without long-term current use of insulin (HCC)   Essential hypertension   Hyperlipidemia LDL goal <70   Obesity   Depression   NSTEMI (non-ST elevated myocardial infarction) [facility])    Diagnostic Studies/Procedures    Cath 04/07/2020  Prox RCA lesion is 45% stenosed.  Mid RCA lesion is 50% stenosed.  1st Mrg lesion is 35% stenosed.  Prox LAD to Mid LAD lesion is 80% stenosed.  1st Diag lesion is 90% stenosed.  Mid LAD lesion is 50% stenosed.   49 year old male with borderline hypertension hyperlipidemia and family history of cardiovascular disease with progressive anginal symptoms and now chest pain at rest or with elevated troponin consistent with non-ST elevation myocardial infarction  LV function with normal ejection fraction 60%  Moderate atherosclerosis of 2 vessels and significant atherosclerosis of proximal left anterior descending artery 80% consistent with culprit lesion for non-ST elevation myocardial infarction  Plan PCI and stent placement with atherectomy of left anterior descending artery Dual antiplatelet therapy High intensity cholesterol therapy Beta-blocker ACE inhibitor for hypertension control and non-ST elevation myocardial infarction Cardiac rehabilitation  Cath 5/11/2021Prox LAD to Mid LAD lesion is 80% stenosed.  A drug-eluting stent was successfully placed using a STENT RESOLUTE ONYX 4.0X38.  Post intervention, there is a 0% residual stenosis.  1st Diag lesion is 90%  stenosed.  Balloon angioplasty was performed using a BALLOON SAPPHIRE 2.25X10.  Post intervention, there is a 65% residual stenosis.   1. Successful atherectomy and stenting of the proximal to mid LAD with IVUS guidance. 2. POBA of the first diagonal. There is some residual stenosis but there remains TIMI 3 flow.   Plan: DAPT for one year. Aggressive risk factor modification. Anticipate DC in am.   Diagnostic Dominance: Right  Intervention    _____________    Echo 04/09/2020 1. Left ventricular ejection fraction, by estimation, is 70 to 75%. The  left ventricle has hyperdynamic function. The left ventricle has no  regional wall motion abnormalities. There is mild concentric left  ventricular hypertrophy. Left ventricular  diastolic parameters are consistent with Grade I diastolic dysfunction  (impaired relaxation).  2. Right ventricular systolic function is normal. The right ventricular  size is normal.  3. The mitral valve is normal in structure. No evidence of mitral valve  regurgitation. No evidence of mitral stenosis.  4. The aortic valve is normal in structure. Aortic valve regurgitation is  not visualized. No aortic stenosis is present.  5. The inferior vena cava is normal in size with greater than 50%  respiratory variability, suggesting right atrial pressure of 3 mmHg.    History of Present Illness     Oscar BOTELHO Sr. is a 49 y.o. male with no previously known cardiac history.  He was diagnosed with COVID-19 in 10/2019 though did not require hospital admission.  He indicates he had recently been diagnosed with hypertension but not yet started on medications.  He otherwise denies any history of tobacco use, illicit substances, or family history of significant coronary disease.  He does report drinking approximately 5-6 shots on  the weekends.  Over the past couple of weeks he has noted increased fatigue and dyspnea which he attributed to being overweight with  deconditioning.  He was in his usual state of health up until 3 days prior when he began to experience substernal chest pressure with exertion that was initially intermittent.  However, in the early morning hours of 5/10 he again developed midsternal chest pressure at rest and was rated a 10 out of 10 without radiation.  Symptoms were associated with shortness of breath, nausea, and diaphoresis.  Symptoms lasted approximately 45 minutes and were relieved with 1 sublingual nitroglycerin upon EMS arrival.  Upon his arrival to [facility] ED he was noted to be hypertensive with a BP of 186/97, initial high-sensitivity troponin of 67 with a delta and peak of 82.  BNP 10.  D-dimer negative.  Lipase normal.  Glucose 279 with an A1c of 7.0.  Direct LDL 84.  Chest x-ray without acute cardiopulmonary disease.  EKG demonstrated sinus rhythm with nonspecific inferior ST-T changes and anterolateral T wave inversion.  He received aspirin in the ED along with multiple doses of IV labetalol.  [facility] and underwent diagnostic LHC on 04/07/2020 which showed 80% proximal to mid LAD stenosis, mid LAD 50% stenosis, D1 90% stenosis, OM1 35% stenosis, proximal RCA 45% stenosis, mid RCA 50% stenosis, LVEF 60%.  Case was discussed with interventional cardiology with recommendation to [facility] the [facility] coronary atherectomy/PCI which has been scheduled for 04/08/2020.  At time of H&P patient is chest pain-free with BP persisting in the 170s systolic.  Hospital Course     Consultants: N/A  [facility] [place] for PCI.  He had staged PCI on 04/08/2020 which revealed 80% proximal to mid LAD lesion successfully treated with resolute Onyx 4.0 x 38 mm drug-eluting stent, 90% first diagonal was treated with balloon angioplasty, reducing the 90% lesion down to 65% residual stenosis.  Postprocedure, patient was kept overnight for  observation.  He was seen in the morning of 04/09/2020 at which time he denies any chest pain or shortness of breath.  Echocardiogram was EF 70 to 75% grade 1 DD, normal RV EF, no significant valve issue.  Note, patient has newly diagnosed diabetes during this admission, hgb A1C was 7.0.  Lipid panel obtained on 04/07/2020 also showed uncontrolled cholesterol.  Total cholesterol 198, HDL low at 35, direct LDL 84, triglyceride 468.  Patient was placed on high intensity statin. He will need fasting lipid panel and LFT in 6-8 weeks. However, given severely elevated triglyceride, he likely will require Vascepa as outpatient, this can be added as outpatient.  Our case manager has reviewed his medication, Effient disorder cost less than $3 for the patient.  Emphasis has been placed on compliance with dual antiplatelet therapy.  Since he just had cardiac catheterization, he cannot take any Metformin for the next 48 hours due to interaction with contrast dye.  We recommend he start on Metformin next week when he see his primary care provider.  If diabetes remains uncontrolled, it may benefit him to start on SGLT2 inhibitor such as Jardiance which is beneficial in cardiac patient.  10 mg daily dosing of Jardiance should also cost no more than $3 for the patient as well. Lisinopril was started during this admission for BP control and also renal protection given new DM II.    Did the patient have an acute  coronary syndrome (MI, NSTEMI, STEMI, etc) this admission?:  Yes                               AHA/ACC Clinical Performance & Quality Measures: 1. Aspirin prescribed? - Yes 2. ADP Receptor Inhibitor (Plavix/Clopidogrel, Brilinta/Ticagrelor or Effient/Prasugrel) prescribed (includes medically managed patients)? - Yes 3. Beta Blocker prescribed? - Yes 4. High Intensity Statin (Lipitor 40-80mg  or Crestor 20-40mg ) prescribed? - Yes 5. EF assessed during THIS hospitalization? - Yes 6. For EF <40%, was ACEI/ARB  prescribed? - Not Applicable (EF >/= 07%) 7. For EF <40%, Aldosterone Antagonist (Spironolactone or Eplerenone) prescribed? - Not Applicable (EF >/= 37%) 8. Cardiac Rehab Phase II ordered (Included Medically managed Patients)? - Yes   _____________  Discharge Vitals Blood pressure (!) 153/76, pulse 75, temperature 99 F (37.2 C), temperature source Oral, resp. rate 14, height 5\' 7"  (1.702 m), weight 104.4 kg, SpO2 99 %.  Filed Weights   04/08/20 1511 04/09/20 0344  Weight: 107.8 kg 104.4 kg    Labs & Radiologic Studies    CBC Recent Labs    04/08/20 2309 04/09/20 0427  WBC 6.8 8.5  HGB 14.7 14.3  HCT 43.8 42.5  MCV 86.7 87.3  PLT 229 106   Basic Metabolic Panel Recent Labs    04/07/20 0643 04/07/20 0643 04/08/20 0430 04/09/20 0427  NA 137   < > 137 135  K 3.7   < > 4.1 3.6  CL 102   < > 103 102  CO2 27   < > 25 22  GLUCOSE 164*   < > 134* 130*  BUN 10   < > 9 9  CREATININE 0.93   < > 0.93 1.06  CALCIUM 8.7*   < > 8.9 9.3  MG 2.4  --   --   --    < > = values in this interval not displayed.   Liver Function Tests Recent Labs    04/07/20 0643  AST 23  ALT 24  ALKPHOS 55  BILITOT 0.5  PROT 7.5  ALBUMIN 4.0   Recent Labs    04/07/20 0643  LIPASE 26   High Sensitivity Troponin:   Recent Labs  Lab 04/07/20 0051 04/07/20 0242 04/07/20 0643 04/07/20 0823  TROPONINIHS 67* 82* 73* 62*    BNP Invalid input(s): POCBNP D-Dimer No results for input(s): DDIMER in the last 72 hours. Hemoglobin A1C Recent Labs    04/07/20 0643  HGBA1C 7.0*   Fasting Lipid Panel Recent Labs    04/07/20 0643  CHOL 198  HDL 35*  LDLCALC UNABLE TO CALCULATE IF TRIGLYCERIDE OVER 400 mg/dL  TRIG 468*  CHOLHDL 5.7  LDLDIRECT 84.2   Thyroid Function Tests No results for input(s): TSH, T4TOTAL, T3FREE, THYROIDAB in the last 72 hours.  Invalid input(s): FREET3 _____________  DG Chest 2 View  Result Date: 04/07/2020 CLINICAL DATA:  49 year old male with chest  pain. EXAM: CHEST - 2 VIEW COMPARISON:  Chest radiograph dated 11/15/2019. FINDINGS: The heart size and mediastinal contours are within normal limits. Both lungs are clear. The visualized skeletal structures are unremarkable. IMPRESSION: No active cardiopulmonary disease. Electronically Signed   By: Anner Crete M.D.   On: 04/07/2020 00:30   CARDIAC CATHETERIZATION  Result Date: 04/08/2020  Prox LAD to Mid LAD lesion is 80% stenosed.  A drug-eluting stent was successfully placed using a STENT RESOLUTE ONYX 4.0X38.  Post intervention, there is a 0%  residual stenosis.  1st Diag lesion is 90% stenosed.  Balloon angioplasty was performed using a BALLOON SAPPHIRE 2.25X10.  Post intervention, there is a 65% residual stenosis.  1. Successful atherectomy and stenting of the proximal to mid LAD with IVUS guidance. 2. POBA of the first diagonal. There is some residual stenosis but there remains TIMI 3 flow. Plan: DAPT for one year. Aggressive risk factor modification. Anticipate DC in am.   CARDIAC CATHETERIZATION  Result Date: 04/07/2020  Prox RCA lesion is 45% stenosed.  Mid RCA lesion is 50% stenosed.  1st Mrg lesion is 35% stenosed.  Prox LAD to Mid LAD lesion is 80% stenosed.  1st Diag lesion is 90% stenosed.  Mid LAD lesion is 50% stenosed.  49 year old male with borderline hypertension hyperlipidemia and family history of cardiovascular disease with progressive anginal symptoms and now chest pain at rest or with elevated troponin consistent with non-ST elevation myocardial infarction LV function with normal ejection fraction 60% Moderate atherosclerosis of 2 vessels and significant atherosclerosis of proximal left anterior descending artery 80% consistent with culprit lesion for non-ST elevation myocardial infarction Plan PCI and stent placement with atherectomy of left anterior descending artery Dual antiplatelet therapy High intensity cholesterol therapy Beta-blocker ACE inhibitor for  hypertension control and non-ST elevation myocardial infarction Cardiac rehabilitation   ECHOCARDIOGRAM COMPLETE  Result Date: 04/09/2020    ECHOCARDIOGRAM REPORT   Patient Name:   Oscar DIVELBISS Sr. Date of Exam: 04/09/2020 Medical Rec #:  [id number]         Height:       67.0 in Accession #:    [id number]        Weight:       230.1 lb Date of Birth:  Oct 19, 1971         BSA:          2.146 m Patient Age:    49 years          BP:           153/76 mmHg Patient Gender: M                 HR:           75 bpm. Exam Location:  Inpatient Procedure: 2D Echo Indications:    NSTEMI I21.4  History:        Patient has no prior history of Echocardiogram examinations.                 CAD; Risk Factors:Hypertension, Dyslipidemia and Diabetes.  Sonographer:    Leeroy Bock Turrentine Referring Phys: 23 RHONDA G BARRETT IMPRESSIONS  1. Left ventricular ejection fraction, by estimation, is 70 to 75%. The left ventricle has hyperdynamic function. The left ventricle has no regional wall motion abnormalities. There is mild concentric left ventricular hypertrophy. Left ventricular diastolic parameters are consistent with Grade I diastolic dysfunction (impaired relaxation).  2. Right ventricular systolic function is normal. The right ventricular size is normal.  3. The mitral valve is normal in structure. No evidence of mitral valve regurgitation. No evidence of mitral stenosis.  4. The aortic valve is normal in structure. Aortic valve regurgitation is not visualized. No aortic stenosis is present.  5. The inferior vena cava is normal in size with greater than 50% respiratory variability, suggesting right atrial pressure of 3 mmHg. FINDINGS  Left Ventricle: Left ventricular ejection fraction, by estimation, is 70 to 75%. The left ventricle has hyperdynamic function. The left ventricle has no regional wall motion abnormalities. The  left ventricular internal cavity size was normal in size. There is mild concentric left ventricular  hypertrophy. Left ventricular diastolic parameters are consistent with Grade I diastolic dysfunction (impaired relaxation). Normal left ventricular filling pressure. Right Ventricle: The right ventricular size is normal. No increase in right ventricular wall thickness. Right ventricular systolic function is normal. Left Atrium: Left atrial size was normal in size. Right Atrium: Right atrial size was normal in size. Pericardium: There is no evidence of pericardial effusion. Mitral Valve: The mitral valve is normal in structure. Normal mobility of the mitral valve leaflets. No evidence of mitral valve regurgitation. No evidence of mitral valve stenosis. Tricuspid Valve: The tricuspid valve is normal in structure. Tricuspid valve regurgitation is mild . No evidence of tricuspid stenosis. Aortic Valve: The aortic valve is normal in structure. Aortic valve regurgitation is not visualized. No aortic stenosis is present. Pulmonic Valve: The pulmonic valve was normal in structure. Pulmonic valve regurgitation is not visualized. No evidence of pulmonic stenosis. Aorta: The aortic root is normal in size and structure. Venous: The inferior vena cava is normal in size with greater than 50% respiratory variability, suggesting right atrial pressure of 3 mmHg. IAS/Shunts: No atrial level shunt detected by color flow Doppler.  LEFT VENTRICLE PLAX 2D LVIDd:         4.65 cm  Diastology LVIDs:         3.36 cm  LV e' lateral:   7.71 cm/s LV PW:         1.01 cm  LV E/e' lateral: 9.0 LV IVS:        1.04 cm  LV e' medial:    8.59 cm/s LVOT diam:     1.90 cm  LV E/e' medial:  8.0 LV SV:         76 LV SV Index:   36 LVOT Area:     2.84 cm  RIGHT VENTRICLE RV S prime:     16.60 cm/s TAPSE (M-mode): 1.8 cm LEFT ATRIUM             Index       RIGHT ATRIUM           Index LA diam:        3.30 cm 1.54 cm/m  RA Area:     15.30 cm LA Vol (A2C):   48.6 ml 22.64 ml/m RA Volume:   34.00 ml  15.84 ml/m LA Vol (A4C):   58.9 ml 27.44 ml/m LA  Biplane Vol: 53.7 ml 25.02 ml/m  AORTIC VALVE LVOT Vmax:   156.00 cm/s LVOT Vmean:  106.000 cm/s LVOT VTI:    0.269 m  AORTA Ao Root diam: 2.80 cm MITRAL VALVE MV Area (PHT): 3.21 cm    SHUNTS MV Decel Time: 236 msec    Systemic VTI:  0.27 m MV E velocity: 69.10 cm/s  Systemic Diam: 1.90 cm MV A velocity: 64.10 cm/s MV E/A ratio:  1.08 Tobias Alexander MD Electronically signed by Tobias Alexander MD Signature Date/Time: 04/09/2020/1:35:40 PM    Final    Disposition   Pt is being discharged home today in good condition.  Follow-up Plans & Appointments    Follow-up Information    Lamar Blinks, MD. Schedule an appointment as soon as possible for a visit.   Specialty: Cardiology Contact information: [address] [facility] [place] [address] [phone]        [facility]. Schedule an appointment as soon as possible for a visit  on 04/16/2020.   Specialty: Family Medicine Why: 10:00AM. PCP visit with Quintella Baton NP Contact information: [address] [place] [address] [phone]          Discharge Instructions    Amb Referral to Cardiac Rehabilitation   Complete by: As directed    Diagnosis:  Coronary Stents NSTEMI     After initial evaluation and assessments completed: Virtual Based Care may be provided alone or in conjunction with Phase 2 Cardiac Rehab based on patient barriers.: Yes      Discharge Medications   Allergies as of 04/09/2020   No Known Allergies     Medication List    TAKE these medications   Advair Diskus 250-50 MCG/DOSE Aepb Generic drug: Fluticasone-Salmeterol Inhale 1 puff into the lungs 2 (two) times daily.   albuterol 108 (90 Base) MCG/ACT inhaler Commonly known as: VENTOLIN HFA Inhale 2 puffs into the lungs every 6 (six) hours as needed for wheezing or shortness of breath.   aspirin 81 MG EC tablet Take 1 tablet (81 mg total) by mouth daily.   atorvastatin 80 MG tablet Commonly known  as: LIPITOR Take 1 tablet (80 mg total) by mouth daily. What changed:   medication strength  how much to take   escitalopram 5 MG tablet Commonly known as: LEXAPRO Take 5 mg by mouth daily.   lisinopril 10 MG tablet Commonly known as: ZESTRIL Take 1 tablet (10 mg total) by mouth daily. Start taking on: Apr 10, 2020   metoprolol succinate 25 MG 24 hr tablet Commonly known as: TOPROL-XL Take 1 tablet (25 mg total) by mouth daily.   mirtazapine 30 MG tablet Commonly known as: REMERON Take 30 mg by mouth at bedtime.   nitroGLYCERIN 0.4 MG SL tablet Commonly known as: NITROSTAT Place 1 tablet (0.4 mg total) under the tongue every 5 (five) minutes x 3 doses as needed for chest pain.   prasugrel 10 MG Tabs tablet Commonly known as: EFFIENT Take 1 tablet (10 mg total) by mouth daily. Start taking on: Apr 10, 2020          Outstanding Labs/Studies   FLP and LFT in 6-8 weeks.   Duration of Discharge Encounter   Greater than 30 minutes including physician time.  Ramond Dial, PA 04/09/2020, 1:49 PM

## 2020-04-09 NOTE — Plan of Care (Signed)

## 2020-04-09 NOTE — Progress Notes (Signed)
CARDIAC REHAB PHASE I   PRE:  Rate/Rhythm: 76 SR  BP:  Sitting: 160/92      SaO2: 96 RA  Offered to walk with pt, pt states he is having a migraine and the light is bother his eyes. Did complete education with pt. Pt educated on importance of ASA, Effient, statin, and NTG. Pt given MI book along with heart healthy and diabetic diets. Reviewed restrictions, site care, and exercise guidelines. Encouraged ambulation prior to d/c. Will refer to CRP II [place].   [phone] Reynold Bowen, RN BSN 04/09/2020 10:42 AM

## 2020-04-09 NOTE — Progress Notes (Signed)
  Echocardiogram 2D Echocardiogram has been performed.  Shontae Rosiles A Hykeem Ojeda 04/09/2020, 11:49 AM

## 2020-04-09 NOTE — ED Triage Notes (Signed)
Pt to ED via [facility] from home. Pt c/o HA and vomiting that started last night. Pt was recently seen for a STEMI with stent placement at [facility]. Pt d/c this morning from [facility]. Pt stating d/c instructions stated to take tylenol but pt has had no relief. Pt stating photosensitivity as well. Pt vomited once PTA. Pt denies CP or SOB. Pt NAD.

## 2020-04-09 NOTE — Care Management (Signed)
1043 04-09-20 Patient has Medicaid and co pay should be no more than $3.00 for prasugrel, Jardiance 10mg /d and vascepa 2gm bid. The Vascepa may have a prior authorization- the only way to know is send Rx to pharmacy and the pharmacy will see if prior authorization is needed. Patient uses [facility] [address] , RN,BSN Case Manager

## 2020-04-10 MED ORDER — SODIUM CHLORIDE 0.9 % IV BOLUS
1000.0000 mL | Freq: Once | INTRAVENOUS | Status: AC
  Administered 2020-04-10: 1000 mL via INTRAVENOUS

## 2020-04-10 NOTE — ED Provider Notes (Signed)
[facility] Emergency Department Provider Note  ____________________________________________   First MD Initiated Contact with Patient 04/09/20 2353     (approximate)  I have reviewed the triage vital signs and the nursing notes.   HISTORY  Chief Complaint Headache and Emesis    HPI Oscar DAIGLER Sr. is a 49 y.o. male   with below list of previous medical conditions including hypertension, diabetes mellitus, dyslipidemia and recent NSTEMI on 04/07/2020 following LAD stent placement 04/07/2020 presents to the emergency department secondary to persistent 10 out of 10 generalized headache which patient states that he had before leaving [facility] today.  Patient states that he was given morphine and Tylenol for headache while in [facility] to improve this pain briefly.  Patient states that he is taken Tylenol since discharge without any improvement.  Patient denies any weakness numbness gait instability or visual changes.  Patient denies any chest pain no shortness of breath        Past Medical History:  Diagnosis Date  . Asthma   . CAD (coronary artery disease)    a. NSTEMI 04/07/20 - LHC 80% p-mLAD, mLAD 50%, D1 90%, OM1 35, pRCA 45%, mRCA 50%, EF 60%  . Essential hypertension   . Hyperlipidemia LDL goal <70   . Obesity     Patient Active Problem List   Diagnosis Date Noted  . NSTEMI (non-ST elevated myocardial infarction) (HCC) 04/08/2020  . Chest pain 04/07/2020  . Non-ST elevation (NSTEMI) myocardial infarction (HCC) 04/07/2020  . CAD (coronary artery disease) 04/07/2020  . Type 2 diabetes mellitus with complication, without long-term current use of insulin (HCC) 04/07/2020  . Essential hypertension 04/07/2020  . Hyperlipidemia LDL goal <70 04/07/2020  . Obesity 04/07/2020  . Depression 04/07/2020    Past Surgical History:  Procedure Laterality Date  . CORONARY ATHERECTOMY N/A 04/08/2020   Procedure: CORONARY ATHERECTOMY;  Surgeon:  Swaziland, Peter M, MD;  Location: [facility] INVASIVE CV LAB;  Service: Cardiovascular;  Laterality: N/A;  . CORONARY STENT INTERVENTION N/A 04/08/2020   Procedure: CORONARY STENT INTERVENTION;  Surgeon: Swaziland, Peter M, MD;  Location: [facility] INVASIVE CV LAB;  Service: Cardiovascular;  Laterality: N/A;  . INTRAVASCULAR ULTRASOUND/IVUS N/A 04/08/2020   Procedure: Intravascular Ultrasound/IVUS;  Surgeon: Swaziland, Peter M, MD;  Location: [facility] INVASIVE CV LAB;  Service: Cardiovascular;  Laterality: N/A;  . LEFT HEART CATH AND CORONARY ANGIOGRAPHY N/A 04/07/2020   Procedure: LEFT HEART CATH AND CORONARY ANGIOGRAPHY;  Surgeon: Lamar Blinks, MD;  Location: [facility] INVASIVE CV LAB;  Service: Cardiovascular;  Laterality: N/A;  . NO PAST SURGERIES      Prior to Admission medications   Medication Sig Start Date End Date Taking? Authorizing Provider  ADVAIR DISKUS 250-50 MCG/DOSE AEPB Inhale 1 puff into the lungs 2 (two) times daily. 11/02/19   [provider]  albuterol (VENTOLIN HFA) 108 (90 Base) MCG/ACT inhaler Inhale 2 puffs into the lungs every 6 (six) hours as needed for wheezing or shortness of breath. 11/15/19   Joni Reining, PA-C  aspirin EC 81 MG EC tablet Take 1 tablet (81 mg total) by mouth daily. 04/09/20   Marrion Coy, MD  atorvastatin (LIPITOR) 80 MG tablet Take 1 tablet (80 mg total) by mouth daily. 04/09/20   Azalee Course, PA  escitalopram (LEXAPRO) 5 MG tablet Take 5 mg by mouth daily.    [provider]  lisinopril (ZESTRIL) 10 MG tablet Take 1 tablet (10 mg total) by mouth daily. 04/10/20  Azalee Course, [place]  metoprolol succinate (TOPROL-XL) 25 MG 24 hr tablet Take 1 tablet (25 mg total) by mouth daily. 04/09/20   Azalee Course, PA  mirtazapine (REMERON) 30 MG tablet Take 30 mg by mouth at bedtime.    [provider]  nitroGLYCERIN (NITROSTAT) 0.4 MG SL tablet Place 1 tablet (0.4 mg total) under the tongue every 5 (five) minutes x 3 doses as needed for chest pain. 04/09/20   Azalee Course, PA   prasugrel (EFFIENT) 10 MG TABS tablet Take 1 tablet (10 mg total) by mouth daily. 04/10/20   Azalee Course, PA    Allergies Patient has no known allergies.  Family History  Problem Relation Age of Onset  . Hypertension Mother   . Healthy Father   . Diabetes Maternal Grandmother     Social History Social History   Tobacco Use  . Smoking status: Never Smoker  . Smokeless tobacco: Never Used  Substance Use Topics  . Alcohol use: Yes    Comment: occasionally  . Drug use: No    Review of systems  constitutional: No fever/chills Eyes: No visual changes. ENT: No sore throat. Cardiovascular: Denies chest pain. Respiratory: Denies shortness of breath. Gastrointestinal: No abdominal pain.  No nausea, no vomiting.  No diarrhea.  No constipation. Genitourinary: Negative for dysuria. Musculoskeletal: Negative for neck pain.  Negative for back pain. Integumentary: Negative for rash. Neurological: Positive for headaches, negative for focal weakness or numbness.  ____________________________________________   PHYSICAL EXAM:  VITAL SIGNS: ED Triage Vitals  Enc Vitals Group     BP --      Pulse Rate 04/09/20 2235 80     Resp 04/09/20 2235 (!) 24     Temp 04/09/20 2235 98 F (36.7 C)     Temp Source 04/09/20 2235 Oral     SpO2 04/09/20 2235 97 %     Weight 04/09/20 2236 104.4 kg (230 lb 1.6 oz)     Height 04/09/20 2236 1.778 m ( )     Head Circumference --      Peak Flow --      Pain Score 04/09/20 2235 8     Pain Loc --      Pain Edu? --      Excl. in GC? --     Constitutional: Alert and oriented.  Eyes: Conjunctivae are normal.  Head: Atraumatic Mouth/Throat: Patient is wearing a mask. Neck: No stridor.  No meningeal signs.   Cardiovascular: Normal rate, regular rhythm. Good peripheral circulation. Grossly normal heart sounds. Respiratory: Normal respiratory effort.  No retractions. Gastrointestinal: Soft and nontender. No distention.   Musculoskeletal: No lower  extremity tenderness nor edema. No gross deformities of extremities. Neurologic:  Normal speech and language. No gross focal neurologic deficits are appreciated.  Skin:  Skin is warm, dry and intact. Psychiatric: Mood and affect are normal. Speech and behavior are normal.  ____________________________________________   LABS (all labs ordered are listed, but only abnormal results are displayed)  Labs Reviewed  COMPREHENSIVE METABOLIC PANEL - Abnormal; Notable for the following components:      Result Value   Sodium 134 (*)    Chloride 97 (*)    Glucose, Bld 172 (*)    Total Protein 8.3 (*)    All other components within normal limits  LIPASE, BLOOD  CBC  URINALYSIS, COMPLETE (UACMP) WITH MICROSCOPIC   ____________________________________________  EKG  ED ECG REPORT I, Bancroft N Jaquell Seddon, the attending physician, personally viewed and interpreted this ECG.  Date: 04/09/2020  EKG Time: 10:35 PM  Rate: 77  Rhythm: Normal sinus rhythm  Axis: Normal  Intervals: Normal  ST&T Change: None  ____________________________________________  RADIOLOGY I, Donora N Allesandra Huebsch, personally viewed and evaluated these images (plain radiographs) as part of my medical decision making, as well as reviewing the written report by the radiologist.  ED MD interpretation:    Official radiology report(s): CT Angio Head W or Wo Contrast  Result Date: 04/10/2020 CLINICAL DATA:  Headache and vomiting EXAM: CT ANGIOGRAPHY HEAD AND NECK TECHNIQUE: Multidetector CT imaging of the head and neck was performed using the standard protocol during bolus administration of intravenous contrast. Multiplanar CT image reconstructions and MIPs were obtained to evaluate the vascular anatomy. Carotid stenosis measurements (when applicable) are obtained utilizing NASCET criteria, using the distal internal carotid diameter as the denominator. CONTRAST:  62mL OMNIPAQUE IOHEXOL 350 MG/ML SOLN COMPARISON:  None. FINDINGS: CT  HEAD FINDINGS Brain: There is no mass, hemorrhage or extra-axial collection. The size and configuration of the ventricles and extra-axial CSF spaces are normal. There is no acute or chronic infarction. The brain parenchyma is normal. Skull: The visualized skull base, calvarium and extracranial soft tissues are normal. Sinuses/Orbits: No fluid levels or advanced mucosal thickening of the visualized paranasal sinuses. No mastoid or middle ear effusion. The orbits are normal. CTA NECK FINDINGS SKELETON: There is no bony spinal canal stenosis. No lytic or blastic lesion. OTHER NECK: Normal pharynx, larynx and major salivary glands. No cervical lymphadenopathy. Unremarkable thyroid gland. UPPER CHEST: No pneumothorax or pleural effusion. No nodules or masses. AORTIC ARCH: There is no calcific atherosclerosis of the aortic arch. There is no aneurysm, dissection or hemodynamically significant stenosis of the visualized portion of the aorta. Conventional 3 vessel aortic branching pattern. The visualized proximal subclavian arteries are widely patent. RIGHT CAROTID SYSTEM: Normal without aneurysm, dissection or stenosis. LEFT CAROTID SYSTEM: Normal without aneurysm, dissection or stenosis. VERTEBRAL ARTERIES: Codominant configuration. Both origins are clearly patent. There is no dissection, occlusion or flow-limiting stenosis to the skull base (V1-V3 segments). CTA HEAD FINDINGS POSTERIOR CIRCULATION: --Vertebral arteries: Normal V4 segments. --Inferior cerebellar arteries: Normal. --Basilar artery: Normal. --Superior cerebellar arteries: Normal. --Posterior cerebral arteries (PCA): Normal. ANTERIOR CIRCULATION: --Intracranial internal carotid arteries: Normal. --Anterior cerebral arteries (ACA): Normal. Both A1 segments are present. Patent anterior communicating artery (a-comm). --Middle cerebral arteries (MCA): Normal. VENOUS SINUSES: As permitted by contrast timing, patent. ANATOMIC VARIANTS: None Review of the MIP images  confirms the above findings. IMPRESSION: Normal CTA of the head and neck. Electronically Signed   By: Deatra Robinson M.D.   On: 04/10/2020 00:17   CT Angio Neck W and/or Wo Contrast  Result Date: 04/10/2020 CLINICAL DATA:  Headache and vomiting EXAM: CT ANGIOGRAPHY HEAD AND NECK TECHNIQUE: Multidetector CT imaging of the head and neck was performed using the standard protocol during bolus administration of intravenous contrast. Multiplanar CT image reconstructions and MIPs were obtained to evaluate the vascular anatomy. Carotid stenosis measurements (when applicable) are obtained utilizing NASCET criteria, using the distal internal carotid diameter as the denominator. CONTRAST:  30mL OMNIPAQUE IOHEXOL 350 MG/ML SOLN COMPARISON:  None. FINDINGS: CT HEAD FINDINGS Brain: There is no mass, hemorrhage or extra-axial collection. The size and configuration of the ventricles and extra-axial CSF spaces are normal. There is no acute or chronic infarction. The brain parenchyma is normal. Skull: The visualized skull base, calvarium and extracranial soft tissues are normal. Sinuses/Orbits: No fluid levels or advanced mucosal thickening of the visualized paranasal  sinuses. No mastoid or middle ear effusion. The orbits are normal. CTA NECK FINDINGS SKELETON: There is no bony spinal canal stenosis. No lytic or blastic lesion. OTHER NECK: Normal pharynx, larynx and major salivary glands. No cervical lymphadenopathy. Unremarkable thyroid gland. UPPER CHEST: No pneumothorax or pleural effusion. No nodules or masses. AORTIC ARCH: There is no calcific atherosclerosis of the aortic arch. There is no aneurysm, dissection or hemodynamically significant stenosis of the visualized portion of the aorta. Conventional 3 vessel aortic branching pattern. The visualized proximal subclavian arteries are widely patent. RIGHT CAROTID SYSTEM: Normal without aneurysm, dissection or stenosis. LEFT CAROTID SYSTEM: Normal without aneurysm, dissection  or stenosis. VERTEBRAL ARTERIES: Codominant configuration. Both origins are clearly patent. There is no dissection, occlusion or flow-limiting stenosis to the skull base (V1-V3 segments). CTA HEAD FINDINGS POSTERIOR CIRCULATION: --Vertebral arteries: Normal V4 segments. --Inferior cerebellar arteries: Normal. --Basilar artery: Normal. --Superior cerebellar arteries: Normal. --Posterior cerebral arteries (PCA): Normal. ANTERIOR CIRCULATION: --Intracranial internal carotid arteries: Normal. --Anterior cerebral arteries (ACA): Normal. Both A1 segments are present. Patent anterior communicating artery (a-comm). --Middle cerebral arteries (MCA): Normal. VENOUS SINUSES: As permitted by contrast timing, patent. ANATOMIC VARIANTS: None Review of the MIP images confirms the above findings. IMPRESSION: Normal CTA of the head and neck. Electronically Signed   By: Ulyses Jarred M.D.   On: 04/10/2020 00:17   ECHOCARDIOGRAM COMPLETE  Result Date: 04/09/2020    ECHOCARDIOGRAM REPORT   Patient Name:   Oscar BORD Sr. Date of Exam: 04/09/2020 Medical Rec #:  [id number]         Height:       67.0 in Accession #:    [id number]        Weight:       230.1 lb Date of Birth:  11-23-1971         BSA:          2.146 m Patient Age:    76 years          BP:           153/76 mmHg Patient Gender: M                 HR:           75 bpm. Exam Location:  Inpatient Procedure: 2D Echo Indications:    NSTEMI I21.4  History:        Patient has no prior history of Echocardiogram examinations.                 CAD; Risk Factors:Hypertension, Dyslipidemia and Diabetes.  Sonographer:    Vikki Ports Turrentine Referring Phys: [place]  1. Left ventricular ejection fraction, by estimation, is 70 to 75%. The left ventricle has hyperdynamic function. The left ventricle has no regional wall motion abnormalities. There is mild concentric left ventricular hypertrophy. Left ventricular diastolic parameters are consistent with Grade I  diastolic dysfunction (impaired relaxation).  2. Right ventricular systolic function is normal. The right ventricular size is normal.  3. The mitral valve is normal in structure. No evidence of mitral valve regurgitation. No evidence of mitral stenosis.  4. The aortic valve is normal in structure. Aortic valve regurgitation is not visualized. No aortic stenosis is present.  5. The inferior vena cava is normal in size with greater than 50% respiratory variability, suggesting right atrial pressure of 3 mmHg. FINDINGS  Left Ventricle: Left ventricular ejection fraction, by estimation, is 70 to 75%. The left ventricle has hyperdynamic function. The  left ventricle has no regional wall motion abnormalities. The left ventricular internal cavity size was normal in size. There is mild concentric left ventricular hypertrophy. Left ventricular diastolic parameters are consistent with Grade I diastolic dysfunction (impaired relaxation). Normal left ventricular filling pressure. Right Ventricle: The right ventricular size is normal. No increase in right ventricular wall thickness. Right ventricular systolic function is normal. Left Atrium: Left atrial size was normal in size. Right Atrium: Right atrial size was normal in size. Pericardium: There is no evidence of pericardial effusion. Mitral Valve: The mitral valve is normal in structure. Normal mobility of the mitral valve leaflets. No evidence of mitral valve regurgitation. No evidence of mitral valve stenosis. Tricuspid Valve: The tricuspid valve is normal in structure. Tricuspid valve regurgitation is mild . No evidence of tricuspid stenosis. Aortic Valve: The aortic valve is normal in structure. Aortic valve regurgitation is not visualized. No aortic stenosis is present. Pulmonic Valve: The pulmonic valve was normal in structure. Pulmonic valve regurgitation is not visualized. No evidence of pulmonic stenosis. Aorta: The aortic root is normal in size and structure. Venous:  The inferior vena cava is normal in size with greater than 50% respiratory variability, suggesting right atrial pressure of 3 mmHg. IAS/Shunts: No atrial level shunt detected by color flow Doppler.  LEFT VENTRICLE PLAX 2D LVIDd:         4.65 cm  Diastology LVIDs:         3.36 cm  LV e' lateral:   7.71 cm/s LV PW:         1.01 cm  LV E/e' lateral: 9.0 LV IVS:        1.04 cm  LV e' medial:    8.59 cm/s LVOT diam:     1.90 cm  LV E/e' medial:  8.0 LV SV:         76 LV SV Index:   36 LVOT Area:     2.84 cm  RIGHT VENTRICLE RV S prime:     16.60 cm/s TAPSE (M-mode): 1.8 cm LEFT ATRIUM             Index       RIGHT ATRIUM           Index LA diam:        3.30 cm 1.54 cm/m  RA Area:     15.30 cm LA Vol (A2C):   48.6 ml 22.64 ml/m RA Volume:   34.00 ml  15.84 ml/m LA Vol (A4C):   58.9 ml 27.44 ml/m LA Biplane Vol: 53.7 ml 25.02 ml/m  AORTIC VALVE LVOT Vmax:   156.00 cm/s LVOT Vmean:  106.000 cm/s LVOT VTI:    0.269 m  AORTA Ao Root diam: 2.80 cm MITRAL VALVE MV Area (PHT): 3.21 cm    SHUNTS MV Decel Time: 236 msec    Systemic VTI:  0.27 m MV E velocity: 69.10 cm/s  Systemic Diam: 1.90 cm MV A velocity: 64.10 cm/s MV E/A ratio:  1.08 Tobias Alexander MD Electronically signed by Tobias Alexander MD Signature Date/Time: 04/09/2020/1:35:40 PM    Final     __________ Procedures   ____________________________________________   INITIAL IMPRESSION / MDM / ASSESSMENT AND PLAN / ED COURSE  As part of my medical decision making, I reviewed the following data within the electronic MEDICAL RECORD NUMBER   49 year old male presented with above-stated history and physical exam secondary to headache and photophobia.  Differential diagnosis including but not limited to migraine headache, tension headache, cluster headache,  considered possibility of CVA however no focal neurological deficits.  Also consider possibility of cerebral aneurysm.  CT angiogram of the head and neck was performed which was normal per radiologist.   Patient given IV morphine 4 mg and Zofran 4 mg with resolution of headache. ____________________________________________  FINAL CLINICAL IMPRESSION(S) / ED DIAGNOSES  Final diagnoses:  Nonintractable headache, unspecified chronicity pattern, unspecified headache type     MEDICATIONS GIVEN DURING THIS VISIT:  Medications  ondansetron (ZOFRAN) injection 4 mg (2 mg Intravenous Given 04/09/20 2339)  morphine 4 MG/ML injection 4 mg (4 mg Intravenous Given 04/09/20 2339)  iohexol (OMNIPAQUE) 350 MG/ML injection 75 mL (75 mLs Intravenous Contrast Given 04/09/20 2351)  sodium chloride 0.9 % bolus 1,000 mL (1,000 mLs Intravenous New Bag/Given 04/10/20 0103)     ED Discharge Orders    None      *Please note:  Oscar HeldLewis J Baskerville Sr. was evaluated in Emergency Department on 04/10/2020 for the symptoms described in the history of present illness. He was evaluated in the context of the global COVID-19 pandemic, which necessitated consideration that the patient might be at risk for infection with the SARS-CoV-2 virus that causes COVID-19. Institutional protocols and algorithms that pertain to the evaluation of patients at risk for COVID-19 are in a state of rapid change based on information released by regulatory bodies including the CDC and federal and state organizations. These policies and algorithms were followed during the patient's care in the ED.  Some ED evaluations and interventions may be delayed as a result of limited staffing during the pandemic.*  Note:  This document was prepared using Dragon voice recognition software and may include unintentional dictation errors.   Darci CurrentBrown,  N, MD 04/10/20 [phone]

## 2020-04-10 NOTE — ED Notes (Signed)
Pt transported to CT ?

## 2020-04-10 NOTE — ED Notes (Signed)
This RN called [employer] for pt upon d/c. Taxi service estimating 

## 2020-05-27 ENCOUNTER — Encounter: Payer: Medicaid Other | Attending: Cardiology | Admitting: Registered"

## 2020-05-27 ENCOUNTER — Encounter: Payer: Self-pay | Admitting: Registered"

## 2020-05-27 ENCOUNTER — Other Ambulatory Visit: Payer: Self-pay

## 2020-05-27 DIAGNOSIS — E119 Type 2 diabetes mellitus without complications: Secondary | ICD-10-CM | POA: Diagnosis present

## 2020-05-27 NOTE — Progress Notes (Signed)
Diabetes Self-Management Education  Visit Type:  First/Initial  Appt. Start Time: 8:47 Appt. End Time: 10:05  05/28/2020  [name], identified by name and date of birth, is a 49 y.o. male with a diagnosis of Diabetes: Type 2.   ASSESSMENT  PCP: Bonney Leitz Pt states he gets exercise when walking to the barber shop every other week. Feels health is very poor because he has to come to nutrition appointment.   Labs from 04/07/2020 reveal elevated A1c (7.0), Chol (198), elevated Trg (468), and decreased HDL (35). States he sweats a lot and feels tired at times. States he is unsure if its hypoglycemia or not. Happens once a week when grocery shopping. States he has frequent urination.   Lives alone beside his mom. Cooks. States he eats a lot of fried foods: fried chicken sandwich, fried eggs, fried potatoes, etc. States he cooks meals for mom. Likes to eat fruit. Likes vegetables: corn, green beans, fried okra sweet peas, cabbage, broccoli, mixed vegetables, collard greens, turnip greens, spinach, iceberg lettuce salads. Likes easy, convenient, quick, fast things to cook.   Typically wakes up at 7 am. Goes to bed around 9-10 pm. Sleeping about 9-10 hrs/night.   Has Medtronic Stent implant 04/08/2020.    There were no vitals taken for this visit. There is no height or weight on file to calculate BMI.    Diabetes Self-Management Education - 05/27/20 0854      Health Coping   How would you rate your overall health? Very Poor      Psychosocial Assessment   Patient Belief/Attitude about Diabetes Motivated to manage diabetes    Self-care barriers None    Self-management support Family;Doctor's office    Patient Concerns Nutrition/Meal planning    Special Needs None    Preferred Learning Style No preference indicated    Learning Readiness Ready      Complications   Last HgB A1C per patient/outside source 7 %    How often do you check your blood sugar? 0 times/day (not testing)      Number of hypoglycemic episodes per month 4    Can you tell when your blood sugar is low? No    Number of hyperglycemic episodes per week 4    Can you tell when your blood sugar is high? No    Have you had a dilated eye exam in the past 12 months? No    Have you had a dental exam in the past 12 months? No    Are you checking your feet? Yes    How many days per week are you checking your feet? 1      Dietary Intake   Breakfast eggs + bacon + oatmeal    Lunch [place] sandwich    Snack (afternoon) cookies, cakes, candy, ice cream    Dinner chicken sandwich + rice or shake and bake bq chicken + green beans + corn    Snack (evening) cake or chips or ice cream    Beverage(s) soda (sprite or mountain dew), Gatorade, sweet iced tea, water (every now and then)      Exercise   Exercise Type Light (walking / raking leaves)   walks to barber shop   How many days per week to you exercise? 1    How many minutes per day do you exercise? 30    Total minutes per week of exercise 30      Patient Education   Previous Diabetes Education No  Disease state  Definition of diabetes, type 1 and 2, and the diagnosis of diabetes;Factors that contribute to the development of diabetes    Nutrition management  Role of diet in the treatment of diabetes and the relationship between the three main macronutrients and blood glucose level;Food label reading, portion sizes and measuring food.;Information on hints to eating out and maintain blood glucose control.    Physical activity and exercise  Role of exercise on diabetes management, blood pressure control and cardiac health.    Medications Reviewed patients medication for diabetes, action, purpose, timing of dose and side effects.    Monitoring Purpose and frequency of SMBG.;Taught/discussed recording of test results and interpretation of SMBG.;Identified appropriate SMBG and/or A1C goals.    Acute complications Taught treatment of hypoglycemia - the 15  rule.;Discussed and identified patients' treatment of hyperglycemia.    Chronic complications Relationship between chronic complications and blood glucose control;Applicable immunizations    Psychosocial adjustment Role of stress on diabetes;Identified and addressed patients feelings and concerns about diabetes      Individualized Goals (developed by patient)   Nutrition General guidelines for healthy choices and portions discussed    Physical Activity Exercise 3-5 times per week;30 minutes per day    Medications take my medication as prescribed    Monitoring  test my blood glucose as discussed    Reducing Risk treat hypoglycemia with 15 grams of carbs if blood glucose less than 70mg /dL;increase portions of healthy fats      Post-Education Assessment   Patient understands the diabetes disease and treatment process. Demonstrates understanding / competency    Patient understands incorporating nutritional management into lifestyle. Demonstrates understanding / competency    Patient undertands incorporating physical activity into lifestyle. Demonstrates understanding / competency    Patient understands using medications safely. Demonstrates understanding / competency    Patient understands monitoring blood glucose, interpreting and using results Demonstrates understanding / competency    Patient understands prevention, detection, and treatment of acute complications. Needs Review    Patient understands prevention, detection, and treatment of chronic complications. Needs Review    Patient understands how to develop strategies to address psychosocial issues. Demonstrates understanding / competency    Patient understands how to develop strategies to promote health/change behavior. Demonstrates understanding / competency      Outcomes   Program Status Not Completed           Learning Objective:  Patient will have a greater understanding of diabetes self-management. Patient education plan is to  attend individual and/or group sessions per assessed needs and concerns.   Plan:   Patient Instructions  - Check into resource: [facility]    [address]    [place], [place]    Phone: [phone]  - Aim to have 1/2 plate non-starchy vegetables with lunch and dinner. Such as:  Salad   Steamable broccoli, mixed vegetables  Green beans, cabbage, turnip greens, collard greens, spinach, etc.   - Great job eating 3 meals/day.   - Snacks between meals to be like peanut butter crackers, cheese and crackers, fruit and nuts, fruit and peanut butter, etc.   - Increase water intake to 1 bottle a day.     Expected Outcomes:  Demonstrated interest in learning. Expect positive outcomes  Education material provided: ADA - How to Thrive: A Guide for Your Journey with Diabetes  If problems or questions, patient to contact team via:  Phone and Email  Future DSME appointment: - 4-6 wks

## 2020-05-27 NOTE — Patient Instructions (Addendum)
-   Check into resource: [facility]    [address]    [place], [place] [address]    Phone: [phone]  - Aim to have 1/2 plate non-starchy vegetables with lunch and dinner. Such as:  Salad   Steamable broccoli, mixed vegetables  Green beans, cabbage, turnip greens, collard greens, spinach, etc.   - Great job eating 3 meals/day.   - Snacks between meals to be like peanut butter crackers, cheese and crackers, fruit and nuts, fruit and peanut butter, etc.   - Increase water intake to 1 bottle a day.

## 2020-07-08 ENCOUNTER — Encounter: Payer: Self-pay | Admitting: Registered"

## 2020-07-08 ENCOUNTER — Other Ambulatory Visit: Payer: Self-pay

## 2020-07-08 ENCOUNTER — Encounter: Payer: Medicaid Other | Attending: Cardiology | Admitting: Registered"

## 2020-07-08 DIAGNOSIS — E119 Type 2 diabetes mellitus without complications: Secondary | ICD-10-CM | POA: Diagnosis present

## 2020-07-08 NOTE — Patient Instructions (Addendum)
-   [place] [facility] Diabetes Education Service [facility] [phone].   - Continue to aim for half plate of non-starchy vegetables (cabbage, spinach, snap beans, broccoli, collard greens, salad, etc) for lunch and dinner.

## 2020-07-08 NOTE — Progress Notes (Signed)
Diabetes Self-Management Education  Visit Type:  Follow-up  Appt. Start Time: 8:55 Appt. End Time: 9:48  07/08/2020  Oscar Norris, identified by name and date of birth, is a 49 y.o. male with a diagnosis of Diabetes: Type 2.   ASSESSMENT  States he does not know how to check his BS numbers. Does not have glucometer. Pt was provided glucometer today and instructions on how to use it.  Glucometer provided: [employer] Guide Me Lot number [id number] Exp date 08/20/2021  Checked BS in office: 126 (fasting).   States he urinates a lot daily.  Pt states he prefers to have access to [place] office because that is a closer drive for him and his aunt, who takes him to appointments.   There were no vitals taken for this visit. There is no height or weight on file to calculate BMI.    Diabetes Self-Management Education - 07/08/20 0858      Psychosocial Assessment   Self-care barriers None    Self-management support Family;Doctor's office    Special Needs None    Preferred Learning Style No preference indicated    Learning Readiness Ready      Complications   How often do you check your blood sugar? 0 times/day (not testing)    Number of hypoglycemic episodes per month 0    Can you tell when your blood sugar is low? Yes    What do you do if your blood sugar is low? will eat a sandwich or snack cake    Number of hyperglycemic episodes per week 7    Can you tell when your blood sugar is high? No    Have you had a dilated eye exam in the past 12 months? No    Have you had a dental exam in the past 12 months? No    Are you checking your feet? Yes    How many days per week are you checking your feet? 7      Dietary Intake   Breakfast 2 sausage patties + 1 pancake (with butter and syrup) + Gatorade    Lunch [place] sandwich + snack cake + 2 candy bars (milky way, kit kat) + lemon/iced tea    Dinner 2 chicken patties + rice + lemon/iced tea    Beverage(s) Gatorade, lemon/iced tea       Exercise   Exercise Type Light (walking / raking leaves)    How many days per week to you exercise? 5    How many minutes per day do you exercise? 30    Total minutes per week of exercise 150      Patient Education   Monitoring Taught/evaluated SMBG meter.;Taught/discussed recording of test results and interpretation of SMBG.;Identified appropriate SMBG and/or A1C goals.;Yearly dilated eye exam      Individualized Goals (developed by patient)   Nutrition Follow meal plan discussed    Physical Activity Exercise 3-5 times per week    Medications take my medication as prescribed    Monitoring  test my blood glucose as discussed    Reducing Risk examine blood glucose patterns;treat hypoglycemia with 15 grams of carbs if blood glucose less than 71m/dL;do foot checks daily      Post-Education Assessment   Patient understands prevention, detection, and treatment of acute complications. Demonstrates understanding / competency    Patient understands prevention, detection, and treatment of chronic complications. Demonstrates understanding / competency      Outcomes   Program Status Completed  Subsequent Visit   Since your last visit have you continued or begun to take your medications as prescribed? Yes    Since your last visit have you had your blood pressure checked? Yes    Is your most recent blood pressure lower, unchanged, or higher since your last visit? Unchanged    Since your last visit have you experienced any weight changes? No change    Since your last visit, are you checking your blood glucose at least once a day? No           Learning Objective:  Patient will have a greater understanding of diabetes self-management. Patient education plan is to attend individual and/or group sessions per assessed needs and concerns.   Plan:   Patient Instructions  - [place] [facility]ntion and Diabetes Education Service [facility] [phone].   - Continue to aim  for half plate of non-starchy vegetables (cabbage, spinach, snap beans, broccoli, collard greens, salad, etc) for lunch and dinner.       Expected Outcomes:  Demonstrated interest in learning. Expect positive outcomes  Education material provided: none  If problems or questions, patient to contact team via:  Phone and Email  Future DSME appointment: - PRN

## 2021-03-26 ENCOUNTER — Other Ambulatory Visit: Payer: Self-pay | Admitting: Physician Assistant

## 2021-04-04 LAB — COLOGUARD: COLOGUARD: NEGATIVE

## 2021-08-17 ENCOUNTER — Ambulatory Visit
Admission: EM | Admit: 2021-08-17 | Discharge: 2021-08-17 | Disposition: A | Discharge status: Home / Self Care | Payer: Medicaid Other | Attending: Family Medicine | Admitting: Family Medicine

## 2021-08-17 ENCOUNTER — Encounter: Payer: Self-pay | Admitting: Emergency Medicine

## 2021-08-17 ENCOUNTER — Other Ambulatory Visit: Payer: Self-pay

## 2021-08-17 DIAGNOSIS — R21 Rash and other nonspecific skin eruption: Secondary | ICD-10-CM | POA: Diagnosis not present

## 2021-08-17 MED ORDER — TRIAMCINOLONE ACETONIDE 0.1 % EX OINT: 1.0000 "application " | TOPICAL_OINTMENT | Freq: Two times a day (BID) | CUTANEOUS | 0 refills | Status: DC

## 2021-08-17 NOTE — Discharge Instructions (Addendum)
Use the medication as directed. No more than 1 week.  If persists, call your PCP for referral to dermatology.   Take care  Dr. Adriana Simas

## 2021-08-17 NOTE — ED Triage Notes (Signed)
Pt c/o rash under bilateral axilla. Started about a week ago. He states the rash is itchy. He has been using hydrocortisone cream and it helps.

## 2021-08-17 NOTE — ED Provider Notes (Signed)
[facility]    CSN: [id number] Arrival date & time: 08/17/21  1761      History   Chief Complaint Chief Complaint  Patient presents with   Rash    HPI 50 year old male presents with rash.  1 week history of rash to the axilla bilaterally.  He states that the area itches.  No known inciting factor.  He denies any recent change in his deodorant or body wash or shampoo.  He states that he has been using hydrocortisone cream with improvement.  However, it has not resolved and he is concerned that it may spread.  No fever.    Past Medical History:  Diagnosis Date   Asthma    CAD (coronary artery disease)    a. NSTEMI 04/07/20 - LHC 80% p-mLAD, mLAD 50%, D1 90%, OM1 35, pRCA 45%, mRCA 50%, EF 60%   Diabetes mellitus without complication (HCC)    Essential hypertension    Hyperlipidemia LDL goal <70    Obesity     Patient Active Problem List   Diagnosis Date Noted   NSTEMI (non-ST elevated myocardial infarction) (HCC) 04/08/2020   Chest pain 04/07/2020   Non-ST elevation (NSTEMI) myocardial infarction (HCC) 04/07/2020   CAD (coronary artery disease) 04/07/2020   Type 2 diabetes mellitus with complication, without long-term current use of insulin (HCC) 04/07/2020   Essential hypertension 04/07/2020   Hyperlipidemia LDL goal <70 04/07/2020   Obesity 04/07/2020   Depression 04/07/2020    Past Surgical History:  Procedure Laterality Date   CORONARY ATHERECTOMY N/A 04/08/2020   Procedure: CORONARY ATHERECTOMY;  Surgeon: Swaziland, Peter M, MD;  Location: [facility] INVASIVE CV LAB;  Service: Cardiovascular;  Laterality: N/A;   CORONARY STENT INTERVENTION N/A 04/08/2020   Procedure: CORONARY STENT INTERVENTION;  Surgeon: Swaziland, Peter M, MD;  Location: [facility] INVASIVE CV LAB;  Service: Cardiovascular;  Laterality: N/A;   INTRAVASCULAR ULTRASOUND/IVUS N/A 04/08/2020   Procedure: Intravascular Ultrasound/IVUS;  Surgeon: Swaziland, Peter M, MD;  Location: [facility] INVASIVE CV LAB;  Service:  Cardiovascular;  Laterality: N/A;   LEFT HEART CATH AND CORONARY ANGIOGRAPHY N/A 04/07/2020   Procedure: LEFT HEART CATH AND CORONARY ANGIOGRAPHY;  Surgeon: Lamar Blinks, MD;  Location: [facility] INVASIVE CV LAB;  Service: Cardiovascular;  Laterality: N/A;   NO PAST SURGERIES         Home Medications    Prior to Admission medications   Medication Sig Start Date End Date Taking? Authorizing Provider  ADVAIR DISKUS 250-50 MCG/DOSE AEPB Inhale 1 puff into the lungs 2 (two) times daily. 11/02/19  Yes [provider]  albuterol (VENTOLIN HFA) 108 (90 Base) MCG/ACT inhaler Inhale 2 puffs into the lungs every 6 (six) hours as needed for wheezing or shortness of breath. 11/15/19  Yes Joni Reining, PA-C  aspirin EC 81 MG EC tablet Take 1 tablet (81 mg total) by mouth daily. 04/09/20  Yes Marrion Coy, MD  atorvastatin (LIPITOR) 80 MG tablet Take 1 tablet (80 mg total) by mouth daily. 04/09/20  Yes Azalee Course, PA  escitalopram (LEXAPRO) 5 MG tablet Take 5 mg by mouth daily.   Yes [provider]  lisinopril (ZESTRIL) 10 MG tablet Take 1 tablet (10 mg total) by mouth daily. 04/10/20  Yes Azalee Course, PA  metoprolol succinate (TOPROL-XL) 25 MG 24 hr tablet Take 1 tablet (25 mg total) by mouth daily. 04/09/20  Yes Azalee Course, PA  mirtazapine (REMERON) 30 MG tablet Take 30 mg by mouth at bedtime.   Yes [provider]  prasugrel (EFFIENT) 10 MG TABS tablet Take 1 tablet (10 mg total) by mouth daily. 04/10/20  Yes Azalee Course, PA  triamcinolone ointment (KENALOG) 0.1 % Apply 1 application topically 2 (two) times daily. 08/17/21  Yes Jenica Costilow G, DO  nitroGLYCERIN (NITROSTAT) 0.4 MG SL tablet Place 1 tablet (0.4 mg total) under the tongue every 5 (five) minutes x 3 doses as needed for chest pain. 04/09/20   Azalee Course, PA    Family History Family History  Problem Relation Age of Onset   Hypertension Mother    Healthy Father    Diabetes Maternal Grandmother     Social History Social  History   Tobacco Use   Smoking status: Never   Smokeless tobacco: Never  Vaping Use   Vaping Use: Never used  Substance Use Topics   Alcohol use: Yes    Comment: occasionally   Drug use: No     Allergies   Patient has no known allergies.   Review of Systems Review of Systems  Constitutional: Negative.   Skin:  Positive for rash.    Physical Exam Triage Vital Signs ED Triage Vitals  Enc Vitals Group     BP 08/17/21 0901 (!) 163/92     Pulse Rate 08/17/21 0901 66     Resp 08/17/21 0901 18     Temp 08/17/21 0901 98.4 F (36.9 C)     Temp Source 08/17/21 0901 Oral     SpO2 08/17/21 0901 95 %     Weight 08/17/21 0859 230 lb 2.6 oz (104.4 kg)     Height 08/17/21 0859 5\' 10"  (1.778 m)     Head Circumference --      Peak Flow --      Pain Score 08/17/21 0859 0     Pain Loc --      Pain Edu? --      Excl. in GC? --    No data found.  Updated Vital Signs BP (!) 163/92 (BP Location: Left Arm)   Pulse 66   Temp 98.4 F (36.9 C) (Oral)   Resp 18   Ht 5\' 10"  (1.778 m)   Wt 104.4 kg   SpO2 95%   BMI 33.02 kg/m   Visual Acuity Right Eye Distance:   Left Eye Distance:   Bilateral Distance:    Right Eye Near:   Left Eye Near:    Bilateral Near:     Physical Exam Constitutional:      General: He is not in acute distress.    Appearance: Normal appearance. He is not ill-appearing.  HENT:     Head: Normocephalic.  Eyes:     General:        Right eye: No discharge.        Left eye: No discharge.     Conjunctiva/sclera: Conjunctivae normal.  Pulmonary:     Effort: Pulmonary effort is normal. No respiratory distress.  Skin:    Comments: Hyperpigmented, slightly raised rash - axilla.   Neurological:     Mental Status: He is alert.  Psychiatric:        Mood and Affect: Mood normal.        Behavior: Behavior normal.     UC Treatments / Results  Labs (all labs ordered are listed, but only abnormal results are displayed) Labs Reviewed - No data to  display  EKG   Radiology No results found.  Procedures Procedures (including critical care time)  Medications Ordered in  UC Medications - No data to display  Initial Impression / Assessment and Plan / UC Course  I have reviewed the triage vital signs and the nursing notes.  Pertinent labs & imaging results that were available during my care of the patient were reviewed by me and considered in my medical decision making (see chart for details).    50 year old male presents with rash.  Topical triamcinolone as directed.  Use for brief duration.  His rash is mostly on the outside portions of the axilla so I feel that this is a safe option.  Supportive care.  Final Clinical Impressions(s) / UC Diagnoses   Final diagnoses:  Rash     Discharge Instructions      Use the medication as directed. No more than 1 week.  If persists, call your PCP for referral to dermatology.   Take care  Dr. Adriana Simas    ED Prescriptions     Medication Sig Dispense Auth. Provider   triamcinolone ointment (KENALOG) 0.1 % Apply 1 application topically 2 (two) times daily. 30 g Tommie Sams, DO      PDMP not reviewed this encounter.   Everlene Other [place], [place] 08/17/21 [phone]

## 2021-08-31 IMAGING — CR DG CHEST 2V
1 series · 2 of 2 positions shown · non-contrast
Comparison: 11/11/2019

CLINICAL DATA: COVID, shortness of breath

EXAM:
CHEST - 2 VIEW

[Series 1: dg chest 2 view · 0.14mm/px · 2 of 2 slices shown]
[im 1/2]
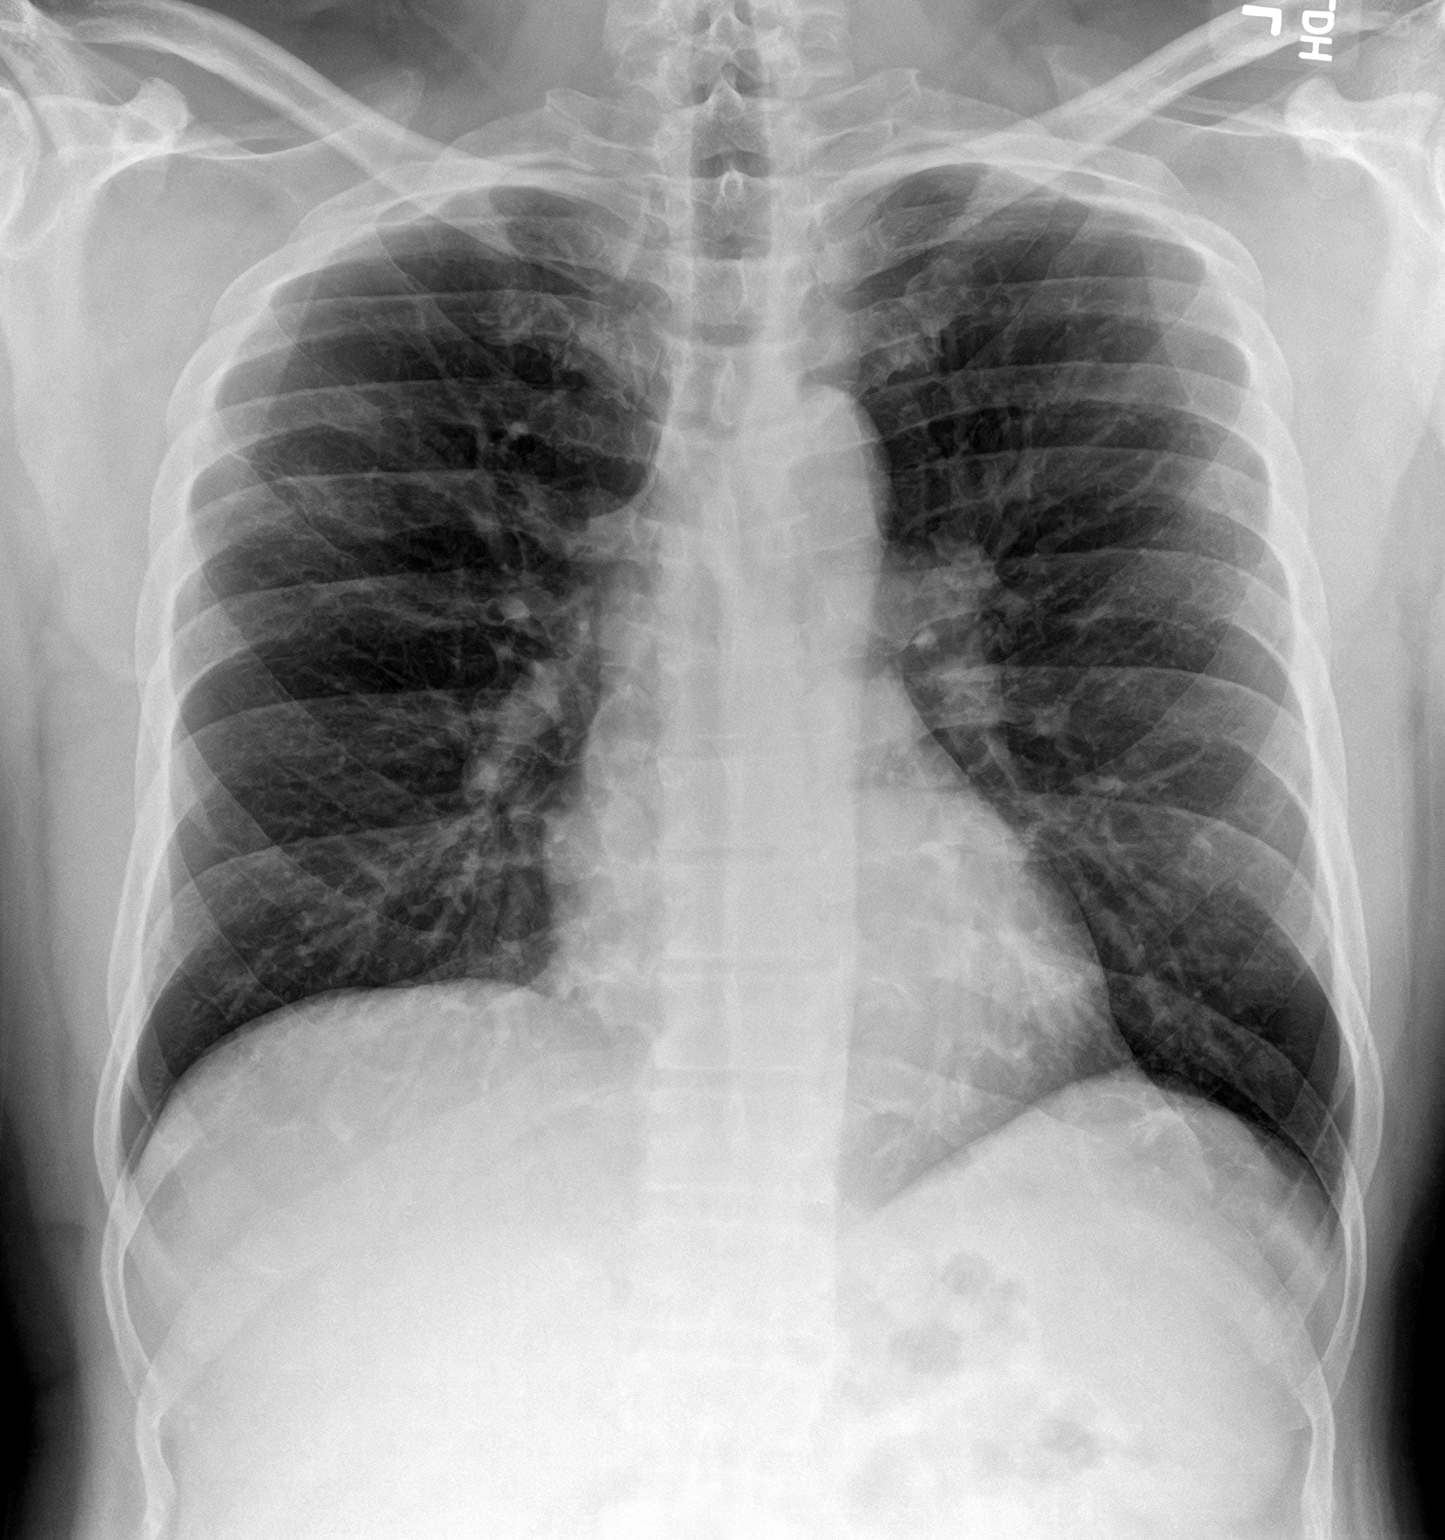
[im 2/2]
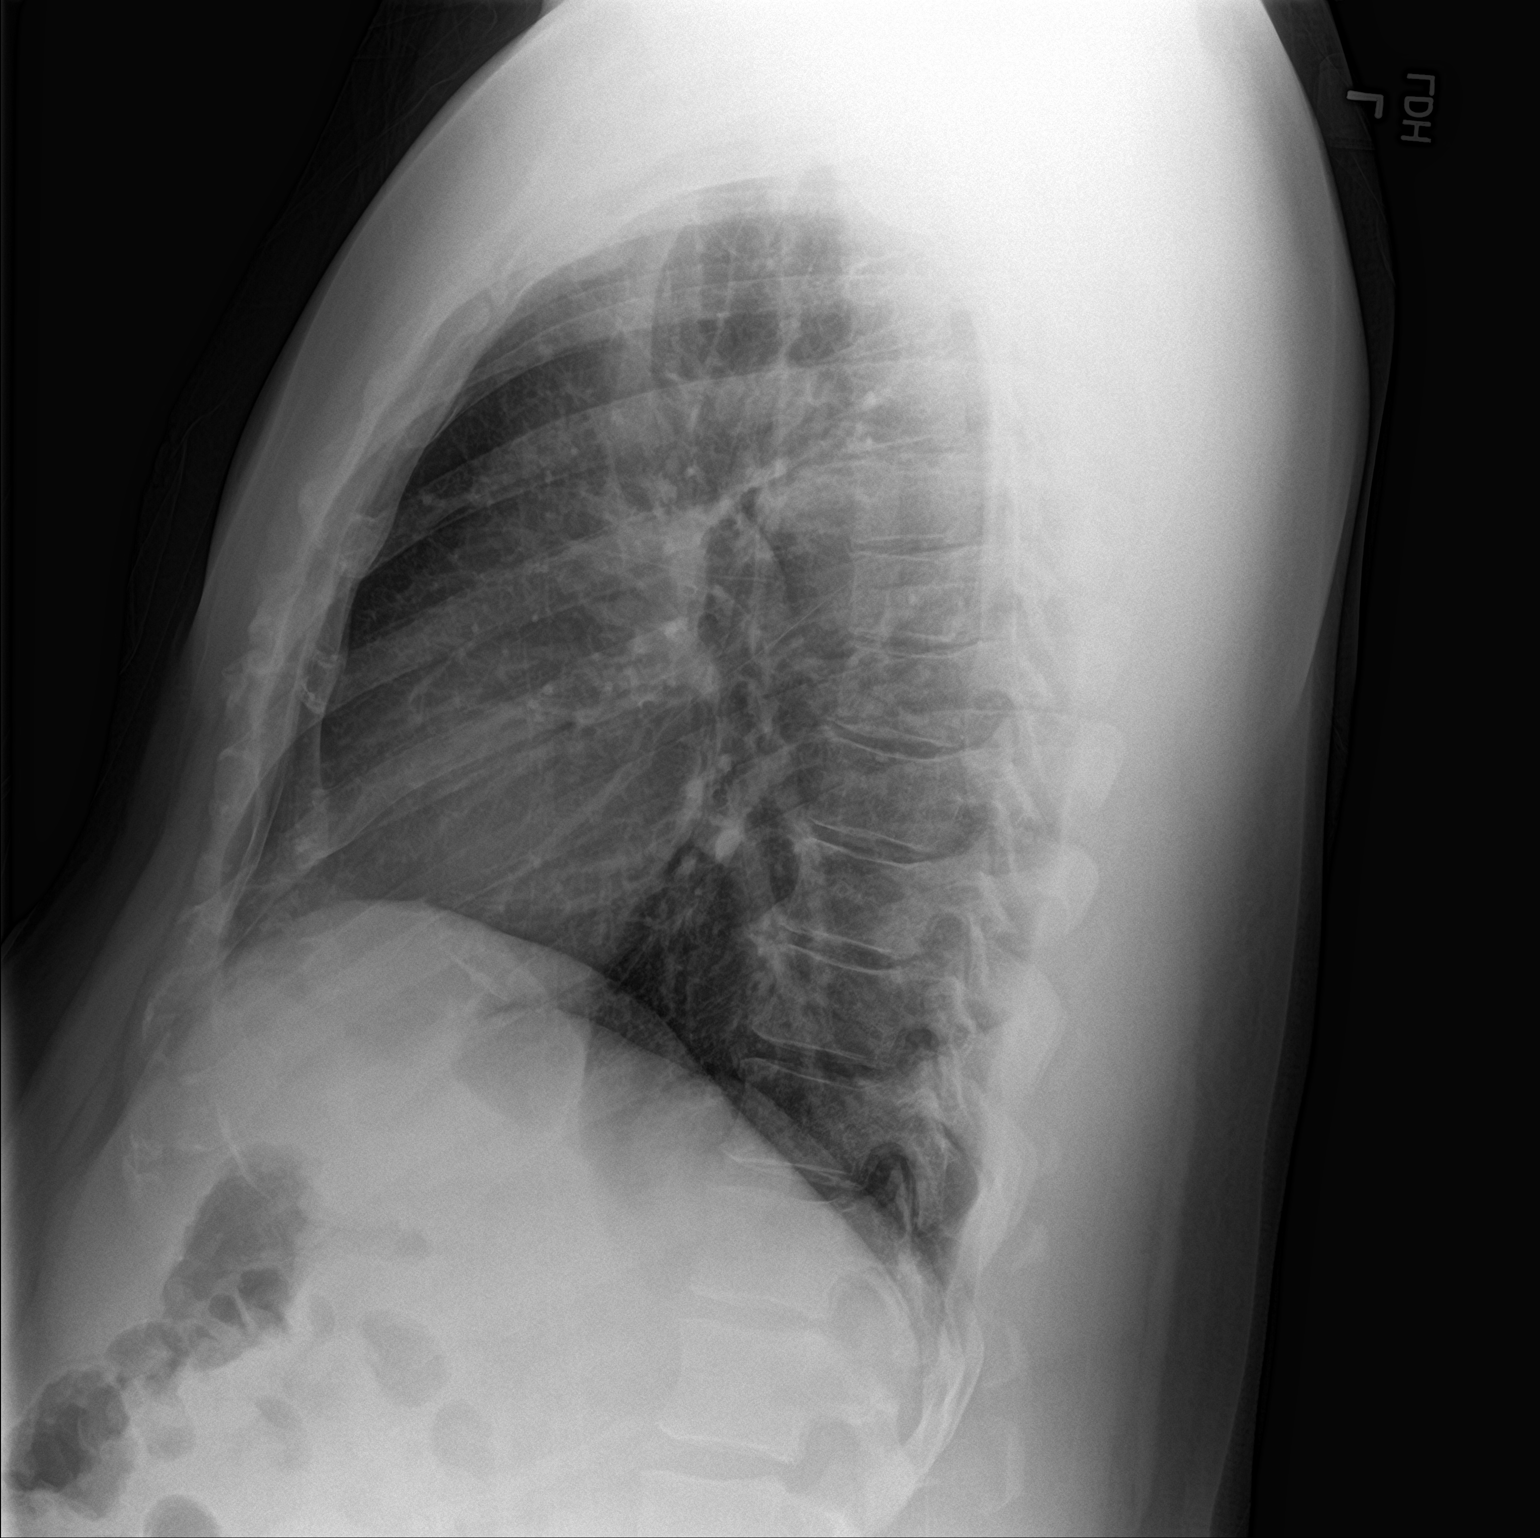

[2 of 2 positions shown; findings below may reference images not displayed]

FINDINGS: The heart size and mediastinal contours are within normal limits.
Subtle heterogeneous airspace opacity is less clearly appreciated on
this examination, possibly improved or less conspicuous due to
improved lung volumes. The visualized skeletal structures are
unremarkable.
IMPRESSION: Subtle heterogeneous airspace opacity is less clearly appreciated on
this examination, possibly improved or less conspicuous due to
improved lung volumes. No new airspace opacity.

## 2021-09-16 ENCOUNTER — Other Ambulatory Visit: Payer: Self-pay

## 2021-09-16 ENCOUNTER — Ambulatory Visit
Admission: EM | Admit: 2021-09-16 | Discharge: 2021-09-16 | Disposition: A | Discharge status: Home / Self Care | Payer: Medicaid Other | Attending: Physician Assistant | Admitting: Physician Assistant

## 2021-09-16 ENCOUNTER — Encounter: Payer: Self-pay | Admitting: Emergency Medicine

## 2021-09-16 DIAGNOSIS — R21 Rash and other nonspecific skin eruption: Secondary | ICD-10-CM

## 2021-09-16 MED ORDER — CLOTRIMAZOLE-BETAMETHASONE 1-0.05 % EX CREA
TOPICAL_CREAM | CUTANEOUS | 0 refills | Status: AC
Start: 2021-09-16 — End: 2021-09-30

## 2021-09-16 MED ORDER — FLUCONAZOLE 150 MG PO TABS: ORAL_TABLET | ORAL | 0 refills | Status: AC

## 2021-09-16 NOTE — Discharge Instructions (Addendum)
-  Your skin rash is likely fungal.  I have sent a cream that has an antifungal and a corticosteroid.  Make sure to keep your axillary region clean and dry.  Consider a antiperspirant.  Additionally, I have sent Diflucan which is an antifungal pill that you take once a week. -This should improve over the next 2 weeks.  If it is not, he may need to return or see a dermatologist.

## 2021-09-16 NOTE — ED Triage Notes (Signed)
PT complains of rash under both arms. Was seen for same 9/19, not improving

## 2021-09-16 NOTE — ED Provider Notes (Signed)
[facility]    CSN: [id number] Arrival date & time: 09/16/21  0940      History   Chief Complaint Chief Complaint  Patient presents with   Rash    HPI Susann Givens Sr. is a 50 y.o. male presenting for 1 month history of hyperpigmented pruritic rash of bilateral axillary region, worse on the right.  Patient was seen at [facility] urgent care 1 month ago and prescribed triamcinolone topical which he says has not helped and he believes the rash is actually worsened.  Patient denies any changes in body washes or deodorants.  Denies similar problem the past.  Patient denies any tries to apply deodorant now with the skin burns too much so he has not been wearing any.  He denies any pain or redness.  No fevers.  No drainage from the skin area.  Has not used any other medications.  No other complaints.  HPI  Past Medical History:  Diagnosis Date   Asthma    CAD (coronary artery disease)    a. NSTEMI 04/07/20 - LHC 80% p-mLAD, mLAD 50%, D1 90%, OM1 35, pRCA 45%, mRCA 50%, EF 60%   Diabetes mellitus without complication (HCC)    Essential hypertension    Hyperlipidemia LDL goal <70    Obesity     Patient Active Problem List   Diagnosis Date Noted   NSTEMI (non-ST elevated myocardial infarction) (HCC) 04/08/2020   Chest pain 04/07/2020   Non-ST elevation (NSTEMI) myocardial infarction (HCC) 04/07/2020   CAD (coronary artery disease) 04/07/2020   Type 2 diabetes mellitus with complication, without long-term current use of insulin (HCC) 04/07/2020   Essential hypertension 04/07/2020   Hyperlipidemia LDL goal <70 04/07/2020   Obesity 04/07/2020   Depression 04/07/2020    Past Surgical History:  Procedure Laterality Date   CORONARY ATHERECTOMY N/A 04/08/2020   Procedure: CORONARY ATHERECTOMY;  Surgeon: Swaziland, Peter M, MD;  Location: [facility] INVASIVE CV LAB;  Service: Cardiovascular;  Laterality: N/A;   CORONARY STENT INTERVENTION N/A 04/08/2020   Procedure: CORONARY STENT  INTERVENTION;  Surgeon: Swaziland, Peter M, MD;  Location: [facility] INVASIVE CV LAB;  Service: Cardiovascular;  Laterality: N/A;   INTRAVASCULAR ULTRASOUND/IVUS N/A 04/08/2020   Procedure: Intravascular Ultrasound/IVUS;  Surgeon: Swaziland, Peter M, MD;  Location: [facility] INVASIVE CV LAB;  Service: Cardiovascular;  Laterality: N/A;   LEFT HEART CATH AND CORONARY ANGIOGRAPHY N/A 04/07/2020   Procedure: LEFT HEART CATH AND CORONARY ANGIOGRAPHY;  Surgeon: Lamar Blinks, MD;  Location: [facility] INVASIVE CV LAB;  Service: Cardiovascular;  Laterality: N/A;   NO PAST SURGERIES         Home Medications    Prior to Admission medications   Medication Sig Start Date End Date Taking? Authorizing Provider  clotrimazole-betamethasone (LOTRISONE) cream Apply to affected area 2 times daily prn 09/16/21 09/30/21 Yes Shirlee Latch, PA-C  fluconazole (DIFLUCAN) 150 MG tablet Take 1 tab PO once weekly 09/16/21  Yes Eusebio Friendly B, PA-C  ADVAIR DISKUS 250-50 MCG/DOSE AEPB Inhale 1 puff into the lungs 2 (two) times daily. 11/02/19   [provider]  albuterol (VENTOLIN HFA) 108 (90 Base) MCG/ACT inhaler Inhale 2 puffs into the lungs every 6 (six) hours as needed for wheezing or shortness of breath. 11/15/19   Joni Reining, PA-C  aspirin EC 81 MG EC tablet Take 1 tablet (81 mg total) by mouth daily. 04/09/20   Marrion Coy, MD  atorvastatin (LIPITOR) 80 MG tablet Take 1 tablet (80 mg total)  by mouth daily. 04/09/20   Azalee Course, PA  escitalopram (LEXAPRO) 5 MG tablet Take 5 mg by mouth daily.    [provider]  lisinopril (ZESTRIL) 10 MG tablet Take 1 tablet (10 mg total) by mouth daily. 04/10/20   Azalee Course, PA  metoprolol succinate (TOPROL-XL) 25 MG 24 hr tablet Take 1 tablet (25 mg total) by mouth daily. 04/09/20   Azalee Course, PA  mirtazapine (REMERON) 30 MG tablet Take 30 mg by mouth at bedtime.    [provider]  nitroGLYCERIN (NITROSTAT) 0.4 MG SL tablet Place 1 tablet (0.4 mg total) under the tongue  every 5 (five) minutes x 3 doses as needed for chest pain. 04/09/20   Azalee Course, PA  prasugrel (EFFIENT) 10 MG TABS tablet Take 1 tablet (10 mg total) by mouth daily. 04/10/20   Azalee Course, PA  triamcinolone ointment (KENALOG) 0.1 % Apply 1 application topically 2 (two) times daily. 08/17/21   Tommie Sams, DO    Family History Family History  Problem Relation Age of Onset   Hypertension Mother    Healthy Father    Diabetes Maternal Grandmother     Social History Social History   Tobacco Use   Smoking status: Never   Smokeless tobacco: Never  Vaping Use   Vaping Use: Never used  Substance Use Topics   Alcohol use: Yes    Comment: occasionally   Drug use: No     Allergies   Patient has no known allergies.   Review of Systems Review of Systems  Constitutional:  Negative for fatigue and fever.  Musculoskeletal:  Negative for arthralgias and myalgias.  Skin:  Positive for color change and rash.    Physical Exam Triage Vital Signs ED Triage Vitals  Enc Vitals Group     BP 09/16/21 0952 (!) 128/91     Pulse Rate 09/16/21 0952 63     Resp 09/16/21 0952 16     Temp 09/16/21 0952 98.4 F (36.9 C)     Temp Source 09/16/21 0952 Oral     SpO2 09/16/21 0952 99 %     Weight --      Height --      Head Circumference --      Peak Flow --      Pain Score 09/16/21 0951 2     Pain Loc --      Pain Edu? --      Excl. in GC? --    No data found.  Updated Vital Signs BP (!) 128/91   Pulse 63   Temp 98.4 F (36.9 C) (Oral)   Resp 16   SpO2 99%      Physical Exam Vitals and nursing note reviewed.  Constitutional:      General: He is not in acute distress.    Appearance: Normal appearance. He is well-developed. He is not ill-appearing.  HENT:     Head: Normocephalic and atraumatic.  Eyes:     General: No scleral icterus.    Conjunctiva/sclera: Conjunctivae normal.  Cardiovascular:     Rate and Rhythm: Normal rate and regular rhythm.  Pulmonary:     Effort:  Pulmonary effort is normal. No respiratory distress.  Musculoskeletal:     Cervical back: Neck supple.  Skin:    General: Skin is warm and dry.     Findings: Rash (hyperpigmented macular rash bilateral axillary region (R>L)) present.  Neurological:     General: No focal deficit present.  Mental Status: He is alert. Mental status is at baseline.     Motor: No weakness.     Coordination: Coordination normal.     Gait: Gait normal.  Psychiatric:        Mood and Affect: Mood normal.        Behavior: Behavior normal.        Thought Content: Thought content normal.     UC Treatments / Results  Labs (all labs ordered are listed, but only abnormal results are displayed) Labs Reviewed - No data to display  EKG   Radiology No results found.  Procedures Procedures (including critical care time)  Medications Ordered in UC Medications - No data to display  Initial Impression / Assessment and Plan / UC Course  I have reviewed the triage vital signs and the nursing notes.  Pertinent labs & imaging results that were available during my care of the patient were reviewed by me and considered in my medical decision making (see chart for details).  50 year old male presenting for hyperpigmented and pruritic rash of bilateral axillary region which did not improve with triamcinolone and seem to worsen.  Clinical presentation more consistent with fungal rash.  We will treat at this time with Lotrisone and once weekly Diflucan.  Also advised keeping the area clean and dry and considering a plain antiperspirant.  Reviewed return precautions.  Final Clinical Impressions(s) / UC Diagnoses   Final diagnoses:  Rash and nonspecific skin eruption     Discharge Instructions      -Your skin rash is likely fungal.  I have sent a cream that has an antifungal and a corticosteroid.  Make sure to keep your axillary region clean and dry.  Consider a antiperspirant.  Additionally, I have sent  Diflucan which is an antifungal pill that you take once a week. -This should improve over the next 2 weeks.  If it is not, he may need to return or see a dermatologist.     ED Prescriptions     Medication Sig Dispense Auth. Provider   clotrimazole-betamethasone (LOTRISONE) cream Apply to affected area 2 times daily prn 45 g Fadel Clason B, PA-C   fluconazole (DIFLUCAN) 150 MG tablet Take 1 tab PO once weekly 4 tablet Shirlee Latch, PA-C      PDMP not reviewed this encounter.   Shirlee Latch, PA-C 09/16/21 1034

## 2022-01-24 IMAGING — CT CT ANGIO HEAD
1 of 10 series · 6 of 33 positions shown · IV contrast (APPLIED)
Comparison: None.

CLINICAL DATA: Headache and vomiting

EXAM:
CT ANGIOGRAPHY HEAD AND NECK
TECHNIQUE: Multidetector CT imaging of the head and neck was performed using
the standard protocol during bolus administration of intravenous
contrast. Multiplanar CT image reconstructions and MIPs were
obtained to evaluate the vascular anatomy. Carotid stenosis
measurements (when applicable) are obtained utilizing NASCET
criteria, using the distal internal carotid diameter as the
denominator.
CONTRAST:  75mL OMNIPAQUE IOHEXOL 350 MG/ML SOLN

[Series 10: ax thin · axial · 0.49mm/px · z∈[-296,-39]mm · 6 of 361 slices shown]
[im 52/361  soft-tissue]
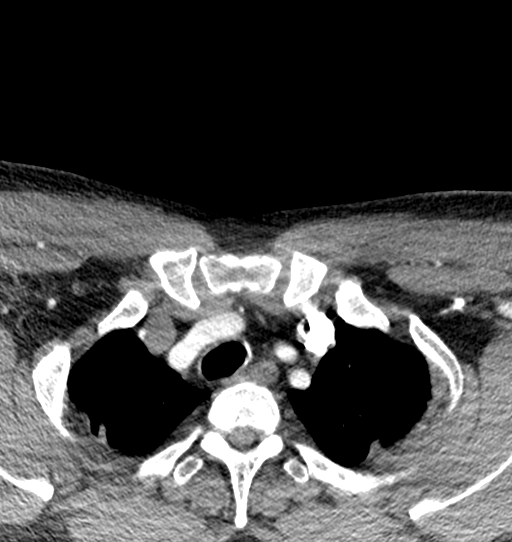
[im 103/361  bone]
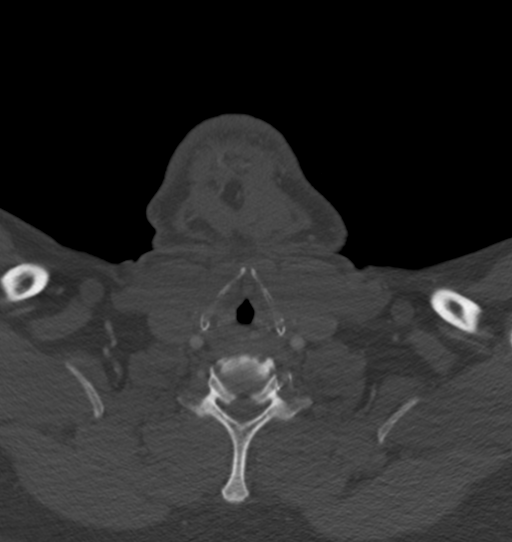
[im 155/361  soft-tissue]
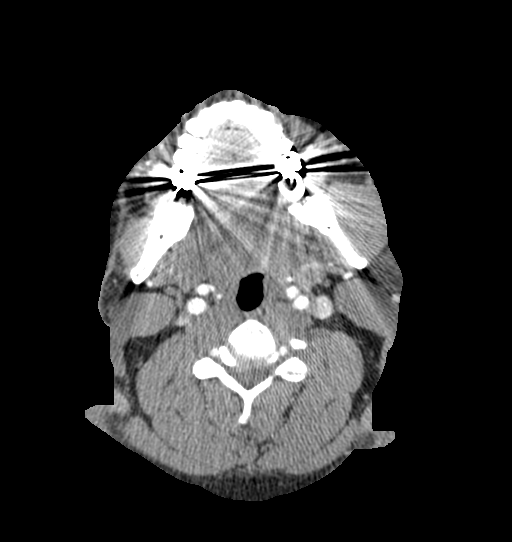
[im 206/361  bone]
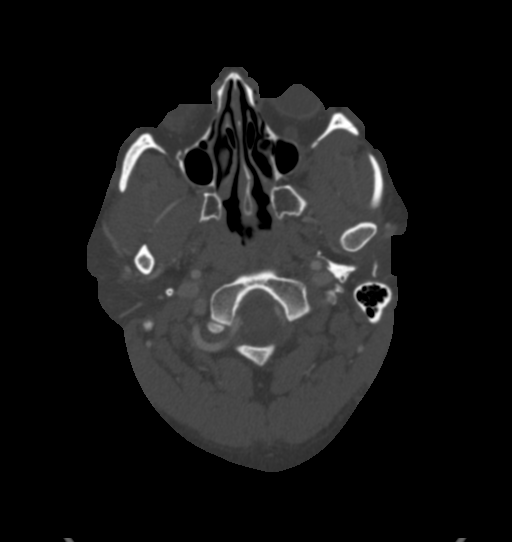
[im 258/361  soft-tissue]
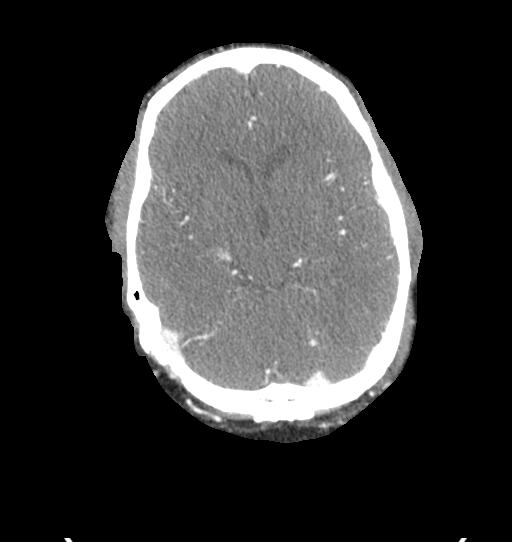
[im 309/361  bone]
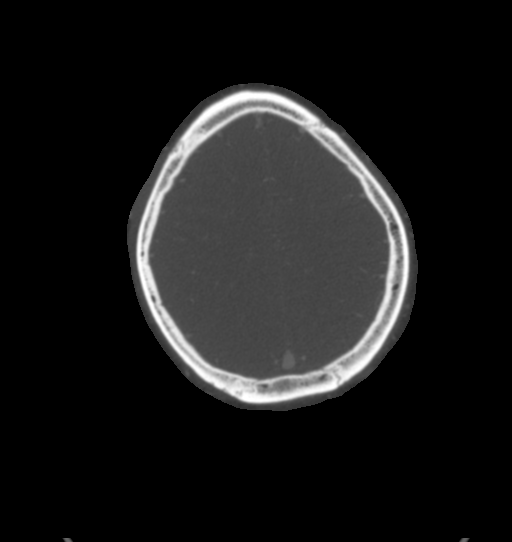

[6 of 33 positions shown; findings below may reference images not displayed]

FINDINGS: CT HEAD FINDINGS

Brain: There is no mass, hemorrhage or extra-axial collection. The
size and configuration of the ventricles and extra-axial CSF spaces
are normal. There is no acute or chronic infarction. The brain
parenchyma is normal.

Skull: The visualized skull base, calvarium and extracranial soft
tissues are normal.

Sinuses/Orbits: No fluid levels or advanced mucosal thickening of
the visualized paranasal sinuses. No mastoid or middle ear effusion.
The orbits are normal.

CTA NECK FINDINGS

SKELETON: There is no bony spinal canal stenosis. No lytic or
blastic lesion.

OTHER NECK: Normal pharynx, larynx and major salivary glands. No
cervical lymphadenopathy. Unremarkable thyroid gland.

UPPER CHEST: No pneumothorax or pleural effusion. No nodules or
masses.

AORTIC ARCH:

There is no calcific atherosclerosis of the aortic arch. There is no
aneurysm, dissection or hemodynamically significant stenosis of the
visualized portion of the aorta. Conventional 3 vessel aortic
branching pattern. The visualized proximal subclavian arteries are
widely patent.

RIGHT CAROTID SYSTEM: Normal without aneurysm, dissection or
stenosis.

LEFT CAROTID SYSTEM: Normal without aneurysm, dissection or
stenosis.

VERTEBRAL ARTERIES: Codominant configuration. Both origins are
clearly patent. There is no dissection, occlusion or flow-limiting
stenosis to the skull base (V1-V3 segments).

CTA HEAD FINDINGS

POSTERIOR CIRCULATION:

--Vertebral arteries: Normal V4 segments.

--Inferior cerebellar arteries: Normal.

--Basilar artery: Normal.

--Superior cerebellar arteries: Normal.

--Posterior cerebral arteries (PCA): Normal.

ANTERIOR CIRCULATION:

--Intracranial internal carotid arteries: Normal.

--Anterior cerebral arteries (ACA): Normal. Both A1 segments are
present. Patent anterior communicating artery (a-comm).

--Middle cerebral arteries (MCA): Normal.

VENOUS SINUSES: As permitted by contrast timing, patent.

ANATOMIC VARIANTS: None

Review of the MIP images confirms the above findings.
IMPRESSION: Normal CTA of the head and neck.

## 2024-11-06 ENCOUNTER — Ambulatory Visit
Admission: EM | Admit: 2024-11-06 | Discharge: 2024-11-06 | Disposition: A | Discharge status: Home / Self Care | Payer: MEDICAID

## 2024-11-06 ENCOUNTER — Encounter: Payer: Self-pay | Admitting: Emergency Medicine

## 2024-11-06 DIAGNOSIS — T7840XA Allergy, unspecified, initial encounter: Secondary | ICD-10-CM

## 2024-11-06 DIAGNOSIS — T783XXA Angioneurotic edema, initial encounter: Secondary | ICD-10-CM

## 2024-11-06 MED ORDER — CETIRIZINE HCL 10 MG PO TABS
10.0000 mg | ORAL_TABLET | Freq: Once | ORAL | Status: AC
  Administered 2024-11-06: 10 mg via ORAL

## 2024-11-06 MED ORDER — DEXAMETHASONE SOD PHOSPHATE PF 10 MG/ML IJ SOLN
10.0000 mg | Freq: Once | INTRAMUSCULAR | Status: AC
  Administered 2024-11-06: 10 mg via INTRAMUSCULAR

## 2024-11-06 MED ORDER — CETIRIZINE HCL 10 MG PO TABS: 10.0000 mg | ORAL_TABLET | Freq: Every day | ORAL | Status: DC

## 2024-11-06 MED ORDER — PREDNISONE 10 MG (21) PO TBPK: ORAL_TABLET | ORAL | 0 refills | Status: AC

## 2024-11-06 MED ORDER — FAMOTIDINE 20 MG PO TABS
20.0000 mg | ORAL_TABLET | Freq: Once | ORAL | Status: AC
  Administered 2024-11-06: 20 mg via ORAL

## 2024-11-06 NOTE — ED Triage Notes (Signed)
 Pt presents with itching all over x 1 week.

## 2024-11-06 NOTE — Discharge Instructions (Addendum)
 Take over-the-counter Allegra 180 mg daily or Zyrtec  or Claritin 10 mg daily to help with your itching.  You can take over-the-counter Benadryl, 50 mg at bedtime, as needed for itching and sleep.  Take the prednisone  pack according to the package instructions.  You will taken on tapering dose over a period of 6 days.  Take it with food and always take it first in the morning with breakfast.  Take over-the-counter Pepcid  20 mg twice daily to help with itching as well.  Call your PCP when you leave the urgent care and let them know that we are concerned that you have angioedema as a result of being on lisinopril .  Informed them that we told you to stop the lisinopril  and ask them to be switched to a new antihypertensive.  If you develop any swelling of your lips or tongue, tightness in your throat, or difficulty breathing you need to go to the ER for evaluation.

## 2024-11-06 NOTE — ED Provider Notes (Signed)
 [facility]    CSN: [id number] Arrival date & time: 11/06/24  0819      History   Chief Complaint Chief Complaint  Patient presents with   Pruritis    HPI Oscar WARNE Sr. is a 53 y.o. male.   HPI  53 year old male with past medical history significant for CAD, NSTEMI, essential hypertension, hyperlipidemia, and asthma presents for evaluation of an itchy red rash that has been going on for the past week as well as swelling and tingling of his lips.  He denies any swelling of his tongue, tightness in throat, or shortness of breath.  He has not started any new medications, change personal hygiene products or laundry soaps, or been exposed to any new foods.  Patient does take lisinopril  and he has been on that for the last 3 years.  Past Medical History:  Diagnosis Date   Asthma    CAD (coronary artery disease)    a. NSTEMI 04/07/20 - LHC 80% p-mLAD, mLAD 50%, D1 90%, OM1 35, pRCA 45%, mRCA 50%, EF 60%   Diabetes mellitus without complication (HCC)    Essential hypertension    Hyperlipidemia LDL goal <70    Obesity     Patient Active Problem List   Diagnosis Date Noted   NSTEMI (non-ST elevated myocardial infarction) (HCC) 04/08/2020   Chest pain 04/07/2020   Non-ST elevation (NSTEMI) myocardial infarction (HCC) 04/07/2020   CAD (coronary artery disease) 04/07/2020   Type 2 diabetes mellitus with complication, without long-term current use of insulin  (HCC) 04/07/2020   Essential hypertension 04/07/2020   Hyperlipidemia LDL goal <70 04/07/2020   Obesity 04/07/2020   Depression 04/07/2020    Past Surgical History:  Procedure Laterality Date   CORONARY ATHERECTOMY N/A 04/08/2020   Procedure: CORONARY ATHERECTOMY;  Surgeon: Jordan, Peter M, MD;  Location: [facility] INVASIVE CV LAB;  Service: Cardiovascular;  Laterality: N/A;   CORONARY STENT INTERVENTION N/A 04/08/2020   Procedure: CORONARY STENT INTERVENTION;  Surgeon: Jordan, Peter M, MD;  Location: [facility] INVASIVE CV  LAB;  Service: Cardiovascular;  Laterality: N/A;   CORONARY ULTRASOUND/IVUS N/A 04/08/2020   Procedure: Intravascular Ultrasound/IVUS;  Surgeon: Jordan, Peter M, MD;  Location: [facility] INVASIVE CV LAB;  Service: Cardiovascular;  Laterality: N/A;   LEFT HEART CATH AND CORONARY ANGIOGRAPHY N/A 04/07/2020   Procedure: LEFT HEART CATH AND CORONARY ANGIOGRAPHY;  Surgeon: Hester Wolm JINNY, MD;  Location: [facility] INVASIVE CV LAB;  Service: Cardiovascular;  Laterality: N/A;   NO PAST SURGERIES         Home Medications    Prior to Admission medications   Medication Sig Start Date End Date Taking? Authorizing Provider  ADVAIR DISKUS 250-50 MCG/DOSE AEPB Inhale 1 puff into the lungs 2 (two) times daily. 11/02/19  Yes [provider]  albuterol  (VENTOLIN  HFA) 108 (90 Base) MCG/ACT inhaler Inhale 2 puffs into the lungs every 6 (six) hours as needed for wheezing or shortness of breath. 11/15/19  Yes Claudene Tanda POUR, PA-C  aspirin  EC 81 MG EC tablet Take 1 tablet (81 mg total) by mouth daily. 04/09/20  Yes Laurita Pillion, MD  atorvastatin  (LIPITOR ) 80 MG tablet Take 1 tablet (80 mg total) by mouth daily. 04/09/20  Yes Meng, Hao, PA  escitalopram  (LEXAPRO ) 5 MG tablet Take 5 mg by mouth daily.   Yes [provider]  lisinopril  (ZESTRIL ) 10 MG tablet Take 1 tablet (10 mg total) by mouth daily. 04/10/20  Yes Meng, Hao, PA  losartan-hydrochlorothiazide (HYZAAR) 50-12.5 MG tablet  LOSARTAN POTASSIUM-HCTZ 50-12.5 MG TABS 06/03/22  Yes [provider]  metFORMIN (GLUCOPHAGE-XR) 500 MG 24 hr tablet Take 500 mg by mouth. 04/28/21  Yes [provider]  metoprolol  succinate (TOPROL -XL) 25 MG 24 hr tablet Take 1 tablet (25 mg total) by mouth daily. 04/09/20  Yes Meng, Hao, PA  mirtazapine  (REMERON ) 30 MG tablet Take 30 mg by mouth at bedtime.   Yes [provider]  nitroGLYCERIN  (NITROSTAT ) 0.4 MG SL tablet Place 1 tablet (0.4 mg total) under the tongue every 5 (five) minutes x 3 doses as  needed for chest pain. 04/09/20  Yes Meng, Hao, PA  prasugrel  (EFFIENT ) 10 MG TABS tablet Take 1 tablet (10 mg total) by mouth daily. 04/10/20  Yes Meng, Hao, PA  predniSONE  (STERAPRED UNI-PAK 21 TAB) 10 MG (21) TBPK tablet Take 6 tablets on day 1, 5 tablets day 2, 4 tablets day 3, 3 tablets day 4, 2 tablets day 5, 1 tablet day 6 11/06/24  Yes Bernardino Ditch, NP  fluconazole  (DIFLUCAN ) 150 MG tablet Take 1 tab PO once weekly 09/16/21   Oscar Jolan NOVAK, PA-C    Family History Family History  Problem Relation Age of Onset   Hypertension Mother    Healthy Father    Diabetes Maternal Grandmother     Social History Social History   Tobacco Use   Smoking status: Never   Smokeless tobacco: Never  Vaping Use   Vaping status: Never Used  Substance Use Topics   Alcohol use: Not Currently    Comment: occasionally   Drug use: No     Allergies   Shellfish protein-containing drug products   Review of Systems Review of Systems  HENT:  Positive for facial swelling.   Respiratory:  Negative for cough, chest tightness, shortness of breath and wheezing.   Skin:  Positive for rash.     Physical Exam Triage Vital Signs ED Triage Vitals  Encounter Vitals Group     BP      Girls Systolic BP Percentile      Girls Diastolic BP Percentile      Boys Systolic BP Percentile      Boys Diastolic BP Percentile      Pulse      Resp      Temp      Temp src      SpO2      Weight      Height      Head Circumference      Peak Flow      Pain Score      Pain Loc      Pain Education      Exclude from Growth Chart    No data found.  Updated Vital Signs BP 133/84 (BP Location: Right Arm)   Pulse 74   Temp 97.8 F (36.6 C) (Oral)   Resp 16   Wt 198 lb (89.8 kg)   SpO2 97%   BMI 28.41 kg/m   Visual Acuity Right Eye Distance:   Left Eye Distance:   Bilateral Distance:    Right Eye Near:   Left Eye Near:    Bilateral Near:     Physical Exam Vitals and nursing note reviewed.   Constitutional:      Appearance: Normal appearance. He is not ill-appearing.  HENT:     Head: Normocephalic and atraumatic.     Mouth/Throat:     Mouth: Mucous membranes are moist.     Pharynx: Oropharynx is clear.  No oropharyngeal exudate or posterior oropharyngeal erythema.     Comments: Airway is patent Cardiovascular:     Rate and Rhythm: Normal rate and regular rhythm.     Pulses: Normal pulses.     Heart sounds: Normal heart sounds. No murmur heard.    No friction rub. No gallop.  Pulmonary:     Effort: Pulmonary effort is normal.     Breath sounds: Normal breath sounds. No stridor. No wheezing, rhonchi or rales.  Skin:    General: Skin is warm and dry.     Capillary Refill: Capillary refill takes less than 2 seconds.     Findings: Rash present.     Comments: Erythematous, maculopapular rash on the right forearm.  Neurological:     Mental Status: He is alert.      UC Treatments / Results  Labs (all labs ordered are listed, but only abnormal results are displayed) Labs Reviewed - No data to display  EKG   Radiology No results found.  Procedures Procedures (including critical care time)  Medications Ordered in UC Medications  cetirizine  (ZYRTEC ) tablet 10 mg (has no administration in time range)  famotidine  (PEPCID ) tablet 20 mg (has no administration in time range)  dexamethasone  (DECADRON ) injection 10 mg (has no administration in time range)    Initial Impression / Assessment and Plan / UC Course  I have reviewed the triage vital signs and the nursing notes.  Pertinent labs & imaging results that were available during my care of the patient were reviewed by me and considered in my medical decision making (see chart for details).   Patient is a pleasant, nontoxic-appearing 53 year old male presenting for evaluation of pruritus that has been going on for the last week with associated lip swelling and tingling.  He currently is experiencing an erythematous  rash on his right forearm.  He reports that these rashes will pop up and lasts anywhere from 1 to 2 hours to an entire day and then resolve.  They occur at random spots on the body but are more common on the torso and in the axilla bilaterally.  Currently, he is only exhibiting this current rash on his right forearm.  He also reports that his lips feel swollen and tingly.  Intraoral exam reveals a patent airway and he has no stridor when auscultating over the trachea.  His lungs are clear to auscultation all fields.  He has not been exposed to any new foods and he has not changed laundry detergent or personal hygiene products.  No medication changes.  I suspect that this is allergic reaction and possible angioedema as a result of being on lisinopril .  I am going to treat him with 10 mg of IM Decadron  here in clinic along with 10 mg of oral Zyrtec  and 20 mg of oral Pepcid .  Going to have him stop the lisinopril  and call his PCP when he is discharged to discuss the possibility of angioedema and being switched to a different antihypertensive.  I have given him ER precautions.  He denies need for work note.   Final Clinical Impressions(s) / UC Diagnoses   Final diagnoses:  Angioedema, initial encounter  Allergic reaction, initial encounter     Discharge Instructions      Take over-the-counter Allegra 180 mg daily or Zyrtec  or Claritin 10 mg daily to help with your itching.  You can take over-the-counter Benadryl, 50 mg at bedtime, as needed for itching and sleep.  Take the prednisone  pack according  to the package instructions.  You will taken on tapering dose over a period of 6 days.  Take it with food and always take it first in the morning with breakfast.  Take over-the-counter Pepcid  20 mg twice daily to help with itching as well.  Call your PCP when you leave the urgent care and let them know that we are concerned that you have angioedema as a result of being on lisinopril .  Informed them  that we told you to stop the lisinopril  and ask them to be switched to a new antihypertensive.  If you develop any swelling of your lips or tongue, tightness in your throat, or difficulty breathing you need to go to the ER for evaluation.      ED Prescriptions     Medication Sig Dispense Auth. Provider   predniSONE  (STERAPRED UNI-PAK 21 TAB) 10 MG (21) TBPK tablet Take 6 tablets on day 1, 5 tablets day 2, 4 tablets day 3, 3 tablets day 4, 2 tablets day 5, 1 tablet day 6 21 tablet Bernardino Ditch, NP      PDMP not reviewed this encounter.   Bernardino Ditch, NP 11/06/24 [phone]
